# Patient Record
Sex: Male | Born: 2000 | Race: White | Hispanic: No | Marital: Single | Smoking: Never smoker
Health system: Southern US, Community
[De-identification: ages and names within clinical notes are randomized; demographics above are authoritative.]

## PROBLEM LIST (undated history)

## (undated) DIAGNOSIS — L80 Vitiligo: Secondary | ICD-10-CM

## (undated) DIAGNOSIS — L570 Actinic keratosis: Secondary | ICD-10-CM

## (undated) DIAGNOSIS — E559 Vitamin D deficiency, unspecified: Secondary | ICD-10-CM

## (undated) DIAGNOSIS — L503 Dermatographic urticaria: Secondary | ICD-10-CM

## (undated) DIAGNOSIS — J309 Allergic rhinitis, unspecified: Secondary | ICD-10-CM

## (undated) DIAGNOSIS — F329 Major depressive disorder, single episode, unspecified: Secondary | ICD-10-CM

## (undated) DIAGNOSIS — M2142 Flat foot [pes planus] (acquired), left foot: Secondary | ICD-10-CM

## (undated) DIAGNOSIS — G4721 Circadian rhythm sleep disorder, delayed sleep phase type: Secondary | ICD-10-CM

## (undated) DIAGNOSIS — T8859XA Other complications of anesthesia, initial encounter: Secondary | ICD-10-CM

## (undated) DIAGNOSIS — F909 Attention-deficit hyperactivity disorder, unspecified type: Secondary | ICD-10-CM

## (undated) DIAGNOSIS — G43009 Migraine without aura, not intractable, without status migrainosus: Secondary | ICD-10-CM

## (undated) DIAGNOSIS — R41840 Attention and concentration deficit: Secondary | ICD-10-CM

## (undated) DIAGNOSIS — R42 Dizziness and giddiness: Secondary | ICD-10-CM

## (undated) DIAGNOSIS — R109 Unspecified abdominal pain: Secondary | ICD-10-CM

## (undated) DIAGNOSIS — F419 Anxiety disorder, unspecified: Secondary | ICD-10-CM

## (undated) DIAGNOSIS — M2141 Flat foot [pes planus] (acquired), right foot: Secondary | ICD-10-CM

## (undated) HISTORY — DX: Circadian rhythm sleep disorder, delayed sleep phase type: G47.21

## (undated) HISTORY — DX: Unspecified abdominal pain: R10.9

## (undated) HISTORY — DX: Actinic keratosis: L57.0

## (undated) HISTORY — DX: Anxiety disorder, unspecified: F41.9

## (undated) HISTORY — DX: Vitamin D deficiency, unspecified: E55.9

## (undated) HISTORY — DX: Dermatographic urticaria: L50.3

## (undated) HISTORY — DX: Vitiligo: L80

## (undated) HISTORY — DX: Allergic rhinitis, unspecified: J30.9

## (undated) HISTORY — DX: Attention-deficit hyperactivity disorder, unspecified type: F90.9

## (undated) HISTORY — DX: Attention and concentration deficit: R41.840

## (undated) HISTORY — DX: Flat foot (pes planus) (acquired), right foot: M21.42

## (undated) HISTORY — DX: Dizziness and giddiness: R42

## (undated) HISTORY — DX: Flat foot (pes planus) (acquired), right foot: M21.41

## (undated) HISTORY — PX: TONSILLECTOMY: SUR1361

## (undated) HISTORY — DX: Migraine without aura, not intractable, without status migrainosus: G43.009

## (undated) HISTORY — DX: Major depressive disorder, single episode, unspecified: F32.9

---
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
## 2021-01-31 ENCOUNTER — Encounter: Payer: Self-pay | Admitting: Neurology

## 2021-02-11 ENCOUNTER — Ambulatory Visit (HOSPITAL_COMMUNITY)
Admission: EM | Admit: 2021-02-11 | Discharge: 2021-02-11 | Disposition: A | Discharge status: Home / Self Care | Payer: 59

## 2021-02-11 ENCOUNTER — Other Ambulatory Visit: Payer: Self-pay

## 2021-02-11 DIAGNOSIS — F481 Depersonalization-derealization syndrome: Secondary | ICD-10-CM | POA: Diagnosis not present

## 2021-02-11 NOTE — BH Assessment (Signed)
TTS triage: Patient presents to [facility] for evaluation. He states for the past week he has felt increasingly anxious and "disconnected." Currently prescribed Zoloft by PCP. He denies SI/HI/AVH or substance use.  Patient is routine.

## 2021-02-11 NOTE — Discharge Instructions (Addendum)
Please follow up with your ADHD doctor and FM doctor to discuss what you have been experiencing.

## 2021-02-11 NOTE — ED Provider Notes (Signed)
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 male w/ hx of anxiety and depression and ADHD presents to [facility] due to feeling "disconnected" from reality. Pt here w/ father. Pt states he feels like he is in a "dreamlike" state for extended periods of time over past 2 weeks. Pt states he has never experienced this before. Pt states only major change to lifestyle was that he restarted Qelbree for his ADHD. Pt was originally on 21 day trial for ADHD and experienced no symptoms but since restarting it 1 week later after the trial, he began experiencing symptoms of "mind fog". Pt states nothing exacerbates this but does state that he feels better when he goes to sleep. Pt states he still is sleeping and eating appropriately. 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 Prior to past 2 weeks, pt's anxiety and depression were well controlled with Zoloft 100 mg qd for past 2 years. Pt states he has had multiple trials of medications for ADHD of which he experienced adverse side effects from medication. Pt states the [place] is not particularly effective for his ADHD as he does not notice much difference in concentration.   Pt denies SI/HI/AVH.   Per dad, patient does not appear to exhibit any signs of "disconnected" feelings that the patient has endorsed.  Father did not notice any changes in behavior.  Pt does not smoke, drink, or use any illicit substances besides vaping nicotine.   Psychiatric Specialty Exam  Presentation  General Appearance:Appropriate for Environment  Eye Contact:Good  Speech:Clear and Coherent  Speech  Volume:Normal  Handedness:No data recorded  Mood and Affect  Mood:Euthymic  Affect:Appropriate   Thought Process  Thought Processes:Coherent  Descriptions of Associations:Intact  Orientation:Full (Time, Place and Person)  Thought Content:Logical    Hallucinations:None  Ideas of Reference:None  Suicidal Thoughts:No  Homicidal Thoughts:No   Sensorium  Memory:Immediate Good; Recent Good; Remote Good  Judgment:Good  Insight:Good   Executive Functions  Concentration:Good  Attention Span:Good  Recall:Good  Fund of Knowledge:Good  Language:Good   Psychomotor Activity  Psychomotor Activity:Normal   Assets  Assets:Social Support; Housing; Armed forces logistics/support/administrative officer; Desire for Improvement   Sleep  Sleep:Good  Number of hours: 7   No data recorded  Physical Exam: Physical Exam Vitals and nursing note reviewed.  Constitutional:      Appearance: He is well-developed.  HENT:     Head: Normocephalic and atraumatic.  Eyes:     Conjunctiva/sclera: Conjunctivae normal.  Cardiovascular:     Rate and Rhythm: Normal rate and regular rhythm.     Heart sounds: No murmur heard. Pulmonary:     Effort: Pulmonary effort is normal. No respiratory distress.     Breath sounds: Normal breath sounds.  Abdominal:     Palpations: Abdomen is soft.     Tenderness: There is no abdominal tenderness.  Musculoskeletal:     Cervical back: Neck supple.  Skin:    General: Skin is warm and dry.  Neurological:     Mental Status: He is alert.   Review of Systems  Constitutional:  Negative for chills and fever.  Respiratory:  Negative for cough, shortness of breath and wheezing.  Cardiovascular:  Negative for chest pain and palpitations.  Gastrointestinal:  Negative for abdominal pain, nausea and vomiting.  Skin:  Negative for itching and rash.  Neurological:  Negative for dizziness and headaches.  Blood pressure 128/89, pulse 71, temperature 98.5 F (36.9 C), temperature  source Oral, resp. rate 18, height 6' (1.829 m), weight 205 lb (93 kg), SpO2 98 %. Body mass index is 27.8 kg/m.  Musculoskeletal: Strength & Muscle Tone: within normal limits Gait & Station: normal Patient leans: [place] MSE Discharge Disposition for Follow up and Recommendations: Based on my evaluation the patient does not appear to have an emergency medical condition and can be discharged with resources and follow up care in outpatient services for Medication Management  Recommended pt follow up w/ ADHD physician and FM physician to discuss symptoms pt is experiencing and potentially adjusting Qelbree dosage/usage.  France Ravens, MD 02/11/2021, 2:23 PM

## 2021-02-13 ENCOUNTER — Encounter (HOSPITAL_BASED_OUTPATIENT_CLINIC_OR_DEPARTMENT_OTHER): Payer: Self-pay

## 2021-02-13 ENCOUNTER — Emergency Department (HOSPITAL_BASED_OUTPATIENT_CLINIC_OR_DEPARTMENT_OTHER): Payer: 59 | Admitting: Radiology

## 2021-02-13 ENCOUNTER — Emergency Department (HOSPITAL_BASED_OUTPATIENT_CLINIC_OR_DEPARTMENT_OTHER)
Admission: EM | Admit: 2021-02-13 | Discharge: 2021-02-13 | Disposition: A | Discharge status: Home / Self Care | Payer: 59 | Attending: Emergency Medicine | Admitting: Emergency Medicine

## 2021-02-13 ENCOUNTER — Emergency Department (HOSPITAL_BASED_OUTPATIENT_CLINIC_OR_DEPARTMENT_OTHER): Payer: 59

## 2021-02-13 DIAGNOSIS — R42 Dizziness and giddiness: Secondary | ICD-10-CM | POA: Diagnosis not present

## 2021-02-13 DIAGNOSIS — R0789 Other chest pain: Secondary | ICD-10-CM | POA: Diagnosis present

## 2021-02-13 DIAGNOSIS — Z20822 Contact with and (suspected) exposure to covid-19: Secondary | ICD-10-CM | POA: Insufficient documentation

## 2021-02-13 LAB — CBC WITH DIFFERENTIAL/PLATELET
Abs Immature Granulocytes: 0.01 10*3/uL (ref 0.00–0.07)
Basophils Absolute: 0 10*3/uL (ref 0.0–0.1)
Basophils Relative: 1 %
Eosinophils Absolute: 0.1 10*3/uL (ref 0.0–0.5)
Eosinophils Relative: 2 %
HCT: 43 % (ref 39.0–52.0)
Hemoglobin: 14.8 g/dL (ref 13.0–17.0)
Immature Granulocytes: 0 %
Lymphocytes Relative: 13 %
Lymphs Abs: 0.9 10*3/uL (ref 0.7–4.0)
MCH: 29.2 pg (ref 26.0–34.0)
MCHC: 34.4 g/dL (ref 30.0–36.0)
MCV: 85 fL (ref 80.0–100.0)
Monocytes Absolute: 0.5 10*3/uL (ref 0.1–1.0)
Monocytes Relative: 8 %
Neutro Abs: 5.1 10*3/uL (ref 1.7–7.7)
Neutrophils Relative %: 76 %
Platelets: 246 10*3/uL (ref 150–400)
RBC: 5.06 MIL/uL (ref 4.22–5.81)
RDW: 12.1 % (ref 11.5–15.5)
WBC: 6.7 10*3/uL (ref 4.0–10.5)
nRBC: 0 % (ref 0.0–0.2)

## 2021-02-13 LAB — BASIC METABOLIC PANEL
Anion gap: 10 (ref 5–15)
BUN: 18 mg/dL (ref 6–20)
CO2: 25 mmol/L (ref 22–32)
Calcium: 9.6 mg/dL (ref 8.9–10.3)
Chloride: 104 mmol/L (ref 98–111)
Creatinine, Ser: 0.87 mg/dL (ref 0.61–1.24)
GFR, Estimated: >60 mL/min (ref >60)
Glucose, Bld: 94 mg/dL (ref 70–99)
Potassium: 4 mmol/L (ref 3.5–5.1)
Sodium: 139 mmol/L (ref 135–145)

## 2021-02-13 LAB — TROPONIN I (HIGH SENSITIVITY): Troponin I (High Sensitivity): <2 ng/L (ref <18)

## 2021-02-13 LAB — RESP PANEL BY RT-PCR (FLU A&B, COVID) ARPGX2
Influenza A by PCR: NEGATIVE
Influenza B by PCR: NEGATIVE
SARS Coronavirus 2 by RT PCR: NEGATIVE

## 2021-02-13 MED ORDER — SODIUM CHLORIDE 0.9 % IV BOLUS
500.0000 mL | Freq: Once | INTRAVENOUS | Status: AC
  Administered 2021-02-13: 500 mL via INTRAVENOUS

## 2021-02-13 MED ORDER — ACETAMINOPHEN 500 MG PO TABS
1000.0000 mg | ORAL_TABLET | Freq: Four times a day (QID) | ORAL | Status: DC | PRN
Start: 2021-02-13 — End: 2021-02-13
  Administered 2021-02-13: 1000 mg via ORAL
  Filled 2021-02-13: qty 2

## 2021-02-13 NOTE — ED Triage Notes (Signed)
He reports feeling general unease with some chest discomfort and profound feelings of disattachment x 1-2 weeks. He has recently taken and subsequently stopped Qelbree. He has also recently taken a couple of doses of Topamax for h/a. His mother is with him.

## 2021-02-13 NOTE — Discharge Instructions (Addendum)
Stay well-hydrated. Follow-up closely with the doctor that prescribes her medications. Return for new or worsening signs or symptoms.

## 2021-02-13 NOTE — ED Provider Notes (Signed)
[facility] EMERGENC[facility] DEPT Provider Note   CSN: [id number] Arrival date & time: 02/13/21  1009     History Chief Complaint  Patient presents with   chest pain/feelings of disattachment    Garrett Booker is a 20 y.o. male.  Patient presents with feeling generally uneasy, intermittent chest discomfort, dizziness over the past 10 days.  Patient recently was taking and subsequently stopped Deboraha Sprang under guidance of physician.  Patient recently took few doses of Topamax for headache.  Patient presents with his mother.  Patient is also had headaches in the morning the past 3 days and migraine/pressure headaches recently.  No family history concerning.  Patient denies fevers chills or respiratory symptoms.      Past Medical History:  Diagnosis Date   Abdominal pain     Patient Active Problem List   Diagnosis Date Noted   Dizziness 06/01/2015   Abnormal thyroid blood test 11/06/2013   Vitiligo 11/06/2013   Hypovitaminosis D 11/06/2013   Simple constipation Q000111Q   Periumbilical abdominal pain     Past Surgical History:  Procedure Laterality Date   TONSILLECTOMY         Family History  Problem Relation Age of Onset   Celiac disease Neg Hx    Ulcers Neg Hx    Thyroid disease Mother    Cancer Maternal Grandmother        breast   Thyroid disease Maternal Grandmother    Diabetes Maternal Grandfather     Social History   Tobacco Use   Smoking status: Never   Smokeless tobacco: Never  Substance Use Topics   Alcohol use: No   Drug use: No    Home Medications Prior to Admission medications   Medication Sig Start Date End Date Taking? Authorizing Provider  cetirizine (ZYRTEC) 10 MG tablet Take 10 mg by mouth daily.    [provider]  fluticasone (FLONASE) 50 MCG/ACT nasal spray Place 1 spray into both nostrils daily.    [provider]  ibuprofen (ADVIL,MOTRIN) 200 MG tablet Take 200 mg by mouth every 6 (six) hours as  needed.    [provider]  montelukast (SINGULAIR) 5 MG chewable tablet Chew 5 mg by mouth at bedtime.    [provider]  tacrolimus (PROTOPIC) 0.1 % ointment Apply topically 2 (two) times daily.    [provider]  triamcinolone lotion (KENALOG) 0.1 % Apply 1 application topically 3 (three) times daily.    [provider]    Allergies    Albuterol  Review of Systems   Review of Systems  Constitutional:  Positive for fatigue. Negative for chills and fever.  HENT:  Negative for congestion.   Eyes:  Negative for visual disturbance.  Respiratory:  Negative for shortness of breath.   Cardiovascular:  Negative for chest pain.  Gastrointestinal:  Positive for nausea. Negative for abdominal pain and vomiting.  Genitourinary:  Negative for dysuria and flank pain.  Musculoskeletal:  Negative for back pain, neck pain and neck stiffness.  Skin:  Negative for rash.  Neurological:  Positive for dizziness, light-headedness and headaches.   Physical Exam Updated Vital Signs BP 116/65   Pulse 65   Temp 98.1 F (36.7 C)   Resp 18   SpO2 100%   Physical Exam Vitals and nursing note reviewed.  Constitutional:      General: He is not in acute distress.    Appearance: He is well-developed.  HENT:     Head: Normocephalic and atraumatic.  Mouth/Throat:     Mouth: Mucous membranes are moist.  Eyes:     General:        Right eye: No discharge.        Left eye: No discharge.     Conjunctiva/sclera: Conjunctivae normal.  Neck:     Trachea: No tracheal deviation.  Cardiovascular:     Rate and Rhythm: Normal rate and regular rhythm.     Heart sounds: No murmur heard. Pulmonary:     Effort: Pulmonary effort is normal.     Breath sounds: Normal breath sounds.  Abdominal:     General: There is no distension.     Palpations: Abdomen is soft.     Tenderness: There is no abdominal tenderness. There is no guarding.  Musculoskeletal:        General:  Normal range of motion.     Cervical back: Normal range of motion and neck supple. No rigidity.  Skin:    General: Skin is warm.     Capillary Refill: Capillary refill takes less than 2 seconds.     Findings: No rash.  Neurological:     General: No focal deficit present.     Mental Status: He is alert and oriented to person, place, and time.     Cranial Nerves: No cranial nerve deficit.     Sensory: No sensory deficit.     Motor: No weakness.     Coordination: Coordination normal.     Gait: Gait normal.  Psychiatric:        Mood and Affect: Mood is anxious. Mood is not depressed. Affect is not tearful.        Speech: Speech is not rapid and pressured.    ED Results / Procedures / Treatments   Labs (all labs ordered are listed, but only abnormal results are displayed) Labs Reviewed  RESP PANEL BY RT-PCR (FLU A&B, COVID) [id number]  BASIC METABOLIC PANEL  CBC WITH DIFFERENTIAL/PLATELET  TROPONIN I (HIGH SENSITIVITY)    EKG EKG Interpretation  Date/Time:  Sunday February 13 2021 10:25:34 EDT Ventricular Rate:  67 PR Interval:  135 QRS Duration: 104 QT Interval:  393 QTC Calculation: 415 R Axis:   85 Text Interpretation: Sinus rhythm Borderline Q waves in inferior leads Confirmed by Elnora Morrison [phone]) on 02/13/2021 10:30:43 AM  Radiology DG Chest 2 View  Result Date: 02/13/2021 CLINICAL DATA:  Chest pain. EXAM: CHEST - 2 VIEW COMPARISON:  August 01, 2009 FINDINGS: The heart size and mediastinal contours are within normal limits. Both lungs are clear. The visualized skeletal structures are unremarkable. IMPRESSION: No active cardiopulmonary disease. Electronically Signed   By: Dorise Bullion III M.D.   On: 02/13/2021 11:10   CT HEAD WO CONTRAST (5MM)  Result Date: 02/13/2021 CLINICAL DATA:  Nausea, vomiting, dizziness, head pressure. EXAM: CT HEAD WITHOUT CONTRAST TECHNIQUE: Contiguous axial images were obtained from the base of the skull through the vertex without  intravenous contrast. COMPARISON:  None. FINDINGS: Brain: No hydrocephalus. No mass, hemorrhage, edema or other evidence of acute parenchymal abnormality. No extra-axial hemorrhage. Vascular: No hyperdense vessel or unexpected calcification. Skull: Normal. Negative for fracture or focal lesion. Sinuses/Orbits: No acute finding. Other: None. IMPRESSION: Negative head CT. No intracranial mass, hemorrhage or edema. Electronically Signed   By: Franki Cabot M.D.   On: 02/13/2021 12:10    Procedures Procedures   Medications Ordered in ED Medications  acetaminophen (TYLENOL) tablet 1,000 mg (1,000 mg Oral Given 02/13/21 1206)  sodium chloride 0.9 %  bolus 500 mL (0 mLs Intravenous Stopped 02/13/21 1238)    ED Course  I have reviewed the triage vital signs and the nursing notes.  Pertinent labs & imaging results that were available during my care of the patient were reviewed by me and considered in my medical decision making (see chart for details).    MDM Rules/Calculators/A&P                           Patient with history of anxiety and recently taken off medication presents with atypical chest discomfort and generalized symptoms.  Discussed differential including anxiety, psychiatric, electrolyte imbalances, medication side effect, intracranial with headaches in the morning and vomiting, other.  Patient has not suicidal and feels safe at home.  Plan for generalized blood, CT scan of the head, IV fluids and Tylenol as needed. Troponin negative, no active chest pain, patient very low risk with no risk factors and 20 years of age.  No further work-up for ACS needed at this time.  Patient's blood work was reviewed normal electrolytes, normal kidney function, no signs of significant anemia or bleeding.  CT scan of the head reviewed no swelling, bleeding or obvious mass.  Patient stable for continued outpatient follow-up.  Final Clinical Impression(s) / ED Diagnoses Final diagnoses:  Atypical chest  pain  Dizziness    Rx / DC Orders ED Discharge Orders     None        Elnora Morrison, MD 02/13/21 1255

## 2021-02-25 ENCOUNTER — Encounter: Payer: Self-pay | Admitting: Gastroenterology

## 2021-02-25 ENCOUNTER — Ambulatory Visit: Payer: 59 | Admitting: Gastroenterology

## 2021-02-25 VITALS — BP 122/86 | HR 67 | Ht 69.0 in | Wt 200.0 lb

## 2021-02-25 DIAGNOSIS — R1310 Dysphagia, unspecified: Secondary | ICD-10-CM | POA: Diagnosis not present

## 2021-02-25 DIAGNOSIS — R112 Nausea with vomiting, unspecified: Secondary | ICD-10-CM

## 2021-02-25 NOTE — Patient Instructions (Addendum)
It was my pleasure to provide care to you today. Based on our discussion, I am providing you with my recommendations below:  RECOMMENDATION(S):   ENDOSCOPY:   You have been scheduled for an endoscopy. Please follow written instructions given to you at your visit today.  INHALERS:   If you use inhalers (even only as needed), please bring them with you on the day of your procedure.  FOLLOW UP:  After your procedure, you will receive a call from my office staff regarding my recommendation for follow up.  BMI:  If you are age 16 or younger, your body mass index should be between 19-25. Your Body mass index is 29.53 kg/m. If this is out of the aformentioned range listed, please consider follow up with your Primary Care Provider.   MY CHART:  The [facility] providers would like to encourage you to use [facility] to communicate with providers for non-urgent requests or questions.  Due to long hold times on the telephone, sending your provider a message by [facility] may be a faster and more efficient way to get a response.  Please allow 48 business hours for a response.  Please remember that this is for non-urgent requests.   Thank you for trusting me with your gastrointestinal care!    Dustin Flock, MD

## 2021-02-25 NOTE — Progress Notes (Signed)
HPI : Garrett Booker is a very pleasant 20 year old male referred by Benjiman Core, PA for symptoms of chest pain and tightness, dysphagia and nausea with vomiting.  The symptoms started 8 or 9 days ago.  Started abruptly when he woke up and could not keep breakfast down.  He has been having episodes of significant nausea in the morning followed by vomiting of breakfast.  Typically, the symptoms improved during the course of the day.  He also has frequent symptoms of food getting stuck in his chest, and having to wash it down with beverage.  No episodes of food getting stuck requiring forced vomiting.  He does not have symptoms of heartburn or acid regurgitation.  He denies any chronic lower GI symptoms such as abdominal pain, constipation, diarrhea or blood in the stool.  His weight has been stable. He went to the emergency room about 2 weeks ago with atypical chest pain and general uneasiness.  This was shortly after he had been taken off Camillia Herter for his ADHD.  He had a CT head and labs which were unremarkable.  He was given reassurance and discharged home.   Past Medical History:  Diagnosis Date   Abdominal pain      Past Surgical History:  Procedure Laterality Date   TONSILLECTOMY     Family History  Problem Relation Age of Onset   Celiac disease Neg Hx    Ulcers Neg Hx    Thyroid disease Mother    Cancer Maternal Grandmother        breast   Thyroid disease Maternal Grandmother    Diabetes Maternal Grandfather    Social History   Tobacco Use   Smoking status: Never   Smokeless tobacco: Never  Substance Use Topics   Alcohol use: No   Drug use: No   Current Outpatient Medications  Medication Sig Dispense Refill   cetirizine (ZYRTEC) 10 MG tablet Take 10 mg by mouth daily.     fluticasone (FLONASE) 50 MCG/ACT nasal spray Place 1 spray into both nostrils daily.     ibuprofen (ADVIL,MOTRIN) 200 MG tablet Take 200 mg by mouth every 6 (six) hours as needed.     sertraline  (ZOLOFT) 100 MG tablet Take 100 mg by mouth daily.     No current facility-administered medications for this visit.   Allergies  Allergen Reactions   Albuterol      Review of Systems: All systems reviewed and negative except where noted in HPI.    DG Chest 2 View  Result Date: 02/13/2021 CLINICAL DATA:  Chest pain. EXAM: CHEST - 2 VIEW COMPARISON:  August 01, 2009 FINDINGS: The heart size and mediastinal contours are within normal limits. Both lungs are clear. The visualized skeletal structures are unremarkable. IMPRESSION: No active cardiopulmonary disease. Electronically Signed   By: Dorise Bullion III M.D.   On: 02/13/2021 11:10   CT HEAD WO CONTRAST (5MM)  Result Date: 02/13/2021 CLINICAL DATA:  Nausea, vomiting, dizziness, head pressure. EXAM: CT HEAD WITHOUT CONTRAST TECHNIQUE: Contiguous axial images were obtained from the base of the skull through the vertex without intravenous contrast. COMPARISON:  None. FINDINGS: Brain: No hydrocephalus. No mass, hemorrhage, edema or other evidence of acute parenchymal abnormality. No extra-axial hemorrhage. Vascular: No hyperdense vessel or unexpected calcification. Skull: Normal. Negative for fracture or focal lesion. Sinuses/Orbits: No acute finding. Other: None. IMPRESSION: Negative head CT. No intracranial mass, hemorrhage or edema. Electronically Signed   By: Franki Cabot M.D.   On: 02/13/2021 12:10  Physical Exam: BP 122/86   Pulse 67   Ht '5\' 9"'$  (1.753 m)   Wt 200 lb (90.7 kg)   BMI 29.53 kg/m  Constitutional: Pleasant,well-developed, Caucasian male in no acute distress.  Accompanied by mother HEENT: Normocephalic and atraumatic. Conjunctivae are normal. No scleral icterus. MP2 Neck:  Supple, no thryomegaly Cardiovascular: Normal rate, regular rhythm.  Pulmonary/chest: Effort normal and breath sounds normal. No wheezing, rales or rhonchi. Abdominal: Soft, nondistended, mild tenderness to palpation in the epigastrium. Bowel  sounds active throughout. There are no masses palpable. No hepatomegaly. Extremities: no edema Lymphadenopathy: No cervical adenopathy noted. Neurological: Alert and oriented to person place and time. Skin: Skin is warm and dry. No rashes noted. Psychiatric: Normal mood and affect. Behavior is normal.  CBC    Component Value Date/Time   WBC 6.7 02/13/2021 1134   RBC 5.06 02/13/2021 1134   HGB 14.8 02/13/2021 1134   HCT 43.0 02/13/2021 1134   PLT 246 02/13/2021 1134   MCV 85.0 02/13/2021 1134   MCH 29.2 02/13/2021 1134   MCHC 34.4 02/13/2021 1134   RDW 12.1 02/13/2021 1134   LYMPHSABS 0.9 02/13/2021 1134   MONOABS 0.5 02/13/2021 1134   EOSABS 0.1 02/13/2021 1134   BASOSABS 0.0 02/13/2021 1134    CMP     Component Value Date/Time   NA 139 02/13/2021 1134   K 4.0 02/13/2021 1134   CL 104 02/13/2021 1134   CO2 25 02/13/2021 1134   GLUCOSE 94 02/13/2021 1134   BUN 18 02/13/2021 1134   CREATININE 0.87 02/13/2021 1134   CALCIUM 9.6 02/13/2021 1134   GFRNONAA >60 02/13/2021 1134     ASSESSMENT AND PLAN: 20 year old male with ADHD with 8 to 9 days of nausea and vomiting with dysphagia in the setting of recent medication changes for his ADHD.  I suspect that the symptoms are related to his medication change, however given his symptoms of dysphagia and his demographic (young Caucasian male), he is high risk for eosinophilic esophagitis.  I offered an upper endoscopy to exclude this, versus observation.  Both the patient and his mother would like to proceed with an upper endoscopy.  Dysphagia/nausea/vomiting - EGD  The details, risks (including bleeding, perforation, infection, missed lesions, medication reactions and possible hospitalization or surgery if complications occur), benefits, and alternatives to EGD with possible biopsy and possible dilation were discussed with the patient and he consents to proceed.    Jensen Cheramie E. Candis Schatz, MD [facility]    CC:   Ephriam Jenkins, [place]

## 2021-03-02 ENCOUNTER — Emergency Department (HOSPITAL_BASED_OUTPATIENT_CLINIC_OR_DEPARTMENT_OTHER): Payer: 59

## 2021-03-02 ENCOUNTER — Emergency Department (HOSPITAL_BASED_OUTPATIENT_CLINIC_OR_DEPARTMENT_OTHER)
Admission: EM | Admit: 2021-03-02 | Discharge: 2021-03-02 | Disposition: A | Discharge status: Home / Self Care | Payer: 59 | Attending: Emergency Medicine | Admitting: Emergency Medicine

## 2021-03-02 ENCOUNTER — Other Ambulatory Visit: Payer: Self-pay

## 2021-03-02 ENCOUNTER — Encounter (HOSPITAL_BASED_OUTPATIENT_CLINIC_OR_DEPARTMENT_OTHER): Payer: Self-pay | Admitting: Emergency Medicine

## 2021-03-02 DIAGNOSIS — U071 COVID-19: Secondary | ICD-10-CM | POA: Diagnosis not present

## 2021-03-02 DIAGNOSIS — R0789 Other chest pain: Secondary | ICD-10-CM | POA: Diagnosis present

## 2021-03-02 DIAGNOSIS — Z28310 Unvaccinated for covid-19: Secondary | ICD-10-CM | POA: Diagnosis not present

## 2021-03-02 NOTE — Discharge Instructions (Signed)
Listed below are the current CDC isolation guidelines for non-immunocompromised patients with mild to moderate Covid symptoms.  These guidelines apply to this patient.  The above-mentioned patient should quarantine at home for 5-10 days from the onset of their symptoms.  They may return when they are fever-free for 24 hours without use of medications, or after 10 days.  If they return before the 10th day of symptom onset, they should wear a well-fitted mask until Day 10.

## 2021-03-02 NOTE — ED Notes (Signed)
Dr Langston Masker in room w/pt now.

## 2021-03-02 NOTE — ED Provider Notes (Signed)
[facility] EMERGENCY DEPT Provider Note   CSN: [id number] Arrival date & time: 03/02/21  1104     History Chief Complaint  Patient presents with   Shortness of Breath    Garrett Booker is a 20 y.o. male present emergency department chest tightness in the setting of COVID diagnosis.  The patient reports that he tested positive for COVID 3 days ago began having symptoms at that time.  He reports he had a very mild cough.  He reports gradual onset of bilateral chest tightness which waxes and wanes but is persistent now.  He does suffer from gastritis and heartburn in the past, but is not sure if this feels similar.  He denies lightheadedness, loss of consciousness, or prior history of chest pains.  He denies history of asthma or pulmonary disease.  He reports he is otherwise healthy.  He does report a history of allergies to albuterol (hives/rash).  He has not had the COVID vaccines.  He is in college at [employer].  HPI     Past Medical History:  Diagnosis Date   Abdominal pain     Patient Active Problem List   Diagnosis Date Noted   Dizziness 06/01/2015   Abnormal thyroid blood test 11/06/2013   Vitiligo 11/06/2013   Hypovitaminosis D 11/06/2013   Simple constipation Q000111Q   Periumbilical abdominal pain     Past Surgical History:  Procedure Laterality Date   TONSILLECTOMY         Family History  Problem Relation Age of Onset   Celiac disease Neg Hx    Ulcers Neg Hx    Thyroid disease Mother    Cancer Maternal Grandmother        breast   Thyroid disease Maternal Grandmother    Diabetes Maternal Grandfather     Social History   Tobacco Use   Smoking status: Never   Smokeless tobacco: Never  Substance Use Topics   Alcohol use: No   Drug use: No    Home Medications Prior to Admission medications   Medication Sig Start Date End Date Taking? Authorizing Provider  cetirizine (ZYRTEC) 10 MG tablet Take 10 mg by mouth daily.    [provider]  fluticasone (FLONASE) 50 MCG/ACT nasal spray Place 1 spray into both nostrils daily.    [provider]  ibuprofen (ADVIL,MOTRIN) 200 MG tablet Take 200 mg by mouth every 6 (six) hours as needed.    [provider]  sertraline (ZOLOFT) 100 MG tablet Take 100 mg by mouth daily. 01/30/21   [provider]    Allergies    Albuterol  Review of Systems   Review of Systems  Constitutional:  Negative for chills and fever.  Respiratory:  Positive for cough, chest tightness and shortness of breath.   Cardiovascular:  Negative for palpitations and leg swelling.  Gastrointestinal:  Negative for abdominal pain and vomiting.  Genitourinary:  Negative for dysuria and hematuria.  Musculoskeletal:  Negative for arthralgias and back pain.  Skin:  Negative for color change and rash.  Neurological:  Negative for syncope and light-headedness.  All other systems reviewed and are negative.  Physical Exam Updated Vital Signs BP 123/80 (BP Location: Right Arm)   Pulse 68   Temp 98.5 F (36.9 C) (Oral)   Resp 15   Ht '6\' 3"'$  (1.905 m)   Wt 91.2 kg   SpO2 100%   BMI 25.12 kg/m   Physical Exam Constitutional:      General: He  is not in acute distress. HENT:     Head: Normocephalic and atraumatic.  Eyes:     Conjunctiva/sclera: Conjunctivae normal.     Pupils: Pupils are equal, round, and reactive to light.  Cardiovascular:     Rate and Rhythm: Normal rate and regular rhythm.  Pulmonary:     Effort: Pulmonary effort is normal. No respiratory distress.  Abdominal:     General: There is no distension.     Tenderness: There is no abdominal tenderness.  Skin:    General: Skin is warm and dry.  Neurological:     General: No focal deficit present.     Mental Status: He is alert. Mental status is at baseline.  Psychiatric:        Mood and Affect: Mood normal.        Behavior: Behavior normal.    ED Results / Procedures / Treatments   Labs (all labs  ordered are listed, but only abnormal results are displayed) Labs Reviewed - No data to display  EKG None  Radiology DG Chest Portable 1 View  Result Date: 03/02/2021 CLINICAL DATA:  COVID-19 positivity with shortness of breath EXAM: PORTABLE CHEST 1 VIEW COMPARISON:  02/13/2021 FINDINGS: The heart size and mediastinal contours are within normal limits. Both lungs are clear. The visualized skeletal structures are unremarkable. IMPRESSION: No active disease. Electronically Signed   By: Inez Catalina M.D.   On: 03/02/2021 12:36    Procedures Procedures   Medications Ordered in ED Medications - No data to display  ED Course  I have reviewed the triage vital signs and the nursing notes.  Pertinent labs & imaging results that were available during my care of the patient were reviewed by me and considered in my medical decision making (see chart for details).  Differential diagnosis includes bronchospasm versus gastritis in the setting of COVID-19 versus other.  He is not hypoxic.  He does not appear dehydrated.  He shows no signs or symptoms of sepsis or bacterial pneumonia.  His lungs breath sounds are clear bilaterally, no wheezing to suggest any reactive airway disease.  I personally reviewed his chest x-ray shows no focal infiltrate, no evidence for pneumothorax.  He is PERC negative for PE, I have a very low low suspicion for PE.  We discussed continued quarantine precautions based on the timing of his symptoms.  He has already discussed this with his college and they are aware of his positivity for covid.  Garrett Booker was evaluated in Emergency Department on 03/02/2021 for the symptoms described in the history of present illness. He was evaluated in the context of the global COVID-19 pandemic, which necessitated consideration that the patient might be at risk for infection with the SARS-CoV-2 virus that causes COVID-19. Institutional protocols and algorithms that pertain to the  evaluation of patients at risk for COVID-19 are in a state of rapid change based on information released by regulatory bodies including the CDC and federal and state organizations. These policies and algorithms were followed during the patient's care in the ED.       Final Clinical Impression(s) / ED Diagnoses Final diagnoses:  [id number]    Rx / DC Orders ED Discharge Orders     None        Yitzchak Kothari, Carola Rhine, MD 03/02/21 [phone]

## 2021-03-02 NOTE — ED Triage Notes (Signed)
Pt reports SOB and chest tightness for past few days. Pt tested positive at home for Covid on Monday.  Pt states mild cough and mild nausea.

## 2021-03-15 ENCOUNTER — Encounter: Payer: Self-pay | Admitting: Gastroenterology

## 2021-03-15 ENCOUNTER — Ambulatory Visit (AMBULATORY_SURGERY_CENTER): Payer: 59 | Admitting: Gastroenterology

## 2021-03-15 ENCOUNTER — Other Ambulatory Visit: Payer: Self-pay

## 2021-03-15 VITALS — BP 91/48 | HR 58 | Temp 98.2°F | Resp 16 | Ht 69.0 in | Wt 200.0 lb

## 2021-03-15 DIAGNOSIS — K319 Disease of stomach and duodenum, unspecified: Secondary | ICD-10-CM

## 2021-03-15 DIAGNOSIS — R1310 Dysphagia, unspecified: Secondary | ICD-10-CM | POA: Diagnosis present

## 2021-03-15 DIAGNOSIS — K219 Gastro-esophageal reflux disease without esophagitis: Secondary | ICD-10-CM

## 2021-03-15 DIAGNOSIS — K2970 Gastritis, unspecified, without bleeding: Secondary | ICD-10-CM

## 2021-03-15 DIAGNOSIS — K2289 Other specified disease of esophagus: Secondary | ICD-10-CM

## 2021-03-15 MED ORDER — SODIUM CHLORIDE 0.9 % IV SOLN: 500.0000 mL | Freq: Once | INTRAVENOUS | Status: DC

## 2021-03-15 NOTE — Progress Notes (Signed)
D.T. vital signs. °

## 2021-03-15 NOTE — Progress Notes (Signed)
Pt's states no medical or surgical changes since previsit or office visit. 

## 2021-03-15 NOTE — Progress Notes (Signed)
To pacu, VSS. Report to Rn.tb 

## 2021-03-15 NOTE — Op Note (Signed)
[place] Patient Name: Garrett Booker Procedure Date: 03/15/2021 10:11 AM MRN: [id number] Endoscopist: Nicki Reaper E. Candis Schatz , MD Age: 20 Referring MD:  Date of Birth: 03/31/2001 Gender: Male Account #: [id number] Procedure:                Upper GI endoscopy Indications:              Dysphagia Medicines:                Monitored Anesthesia Care Procedure:                Pre-Anesthesia Assessment:                           - Prior to the procedure, a History and Physical                            was performed, and patient medications and                            allergies were reviewed. The patient's tolerance of                            previous anesthesia was also reviewed. The risks                            and benefits of the procedure and the sedation                            options and risks were discussed with the patient.                            All questions were answered, and informed consent                            was obtained. Prior Anticoagulants: The patient has                            taken no previous anticoagulant or antiplatelet                            agents. ASA Grade Assessment: I - A normal, healthy                            patient. After reviewing the risks and benefits,                            the patient was deemed in satisfactory condition to                            undergo the procedure.                           After obtaining informed consent, the endoscope was  passed under direct vision. Throughout the                            procedure, the patient's blood pressure, pulse, and                            oxygen saturations were monitored continuously. The                            GIF [id number] was introduced through the                            mouth, and advanced to the third part of duodenum.                            The upper GI endoscopy was accomplished without                             difficulty. The patient tolerated the procedure                            well. Scope In: Scope Out: Findings:                 The examined portions of the nasopharynx,                            oropharynx and larynx were normal.                           The examined esophagus was normal. Biopsies were                            obtained from the proximal and distal esophagus                            with cold forceps for histology of suspected                            eosinophilic esophagitis. Estimated blood loss was                            minimal.                           Multiple dispersed erosions with no stigmata of                            recent bleeding were found in the gastric fundus,                            in the gastric body and in the gastric antrum.                            Biopsies were taken with a cold forceps for  Helicobacter pylori testing. Estimated blood loss                            was minimal.                           The exam of the stomach was otherwise normal.                           The examined duodenum was normal. Complications:            No immediate complications. Estimated Blood Loss:     Estimated blood loss was minimal. Impression:               - The examined portions of the nasopharynx,                            oropharynx and larynx were normal.                           - Normal esophagus. Biopsied.                           - Erosive gastropathy with no stigmata of recent                            bleeding. Biopsied. This may be related to                            medications such as NSAIDs. This is unlikely to be                            related to the patient's current symptoms of solid                            dysphagia.                           - Normal examined duodenum. Recommendation:           - Patient has a contact number available for                             emergencies. The signs and symptoms of potential                            delayed complications were discussed with the                            patient. Return to normal activities tomorrow.                            Written discharge instructions were provided to the                            patient.                           -  Resume previous diet.                           - Continue present medications.                           - Await pathology results.                           - Follow up as needed in the GI clinic for ongoing                            symptoms. Omar Gayden E. Candis Schatz, MD 03/15/2021 10:46:46 AM This report has been signed electronically.

## 2021-03-15 NOTE — Progress Notes (Signed)
History and Physical Interval Note: Nausea and vomiting have resolved.  Chest tightness also resolved.  Continues to have sensation of food sitting in his chest when he swallows.  03/15/2021 10:22 AM  Garrett Booker  has presented today for endoscopic procedure(s), with the diagnosis of  Encounter Diagnosis  Name Primary?   Dysphagia, unspecified type Yes  .  The various methods of evaluation and treatment have been discussed with the patient and/or family. After consideration of risks, benefits and other options for treatment, the patient has consented to  the endoscopic procedure(s).   The patient's history has been reviewed, patient examined, no change in status, stable for endoscopic procedure(s).  I have reviewed the patient's chart and labs.  Questions were answered to the patient's satisfaction.     Dorthea Maina E. Candis Schatz, MD [facility]

## 2021-03-15 NOTE — Progress Notes (Signed)
Called to room to assist during endoscopic procedure.  Patient ID and intended procedure confirmed with present staff. Received instructions for my participation in the procedure from the performing physician.  

## 2021-03-15 NOTE — Patient Instructions (Signed)
Resume previous diet and continue current medications. Awaiting pathology results. Follow up as needed in the GI office.  YOU HAD AN ENDOSCOPIC PROCEDURE TODAY AT [facility]:   Refer to the procedure report that was given to you for any specific questions about what was found during the examination.  If the procedure report does not answer your questions, please call your gastroenterologist to clarify.  If you requested that your care partner not be given the details of your procedure findings, then the procedure report has been included in a sealed envelope for you to review at your convenience later.  YOU SHOULD EXPECT: Some feelings of bloating in the abdomen. Passage of more gas than usual.  Walking can help get rid of the air that was put into your GI tract during the procedure and reduce the bloating. If you had a lower endoscopy (such as a colonoscopy or flexible sigmoidoscopy) you may notice spotting of blood in your stool or on the toilet paper. If you underwent a bowel prep for your procedure, you may not have a normal bowel movement for a few days.  Please Note:  You might notice some irritation and congestion in your nose or some drainage.  This is from the oxygen used during your procedure.  There is no need for concern and it should clear up in a day or so.  SYMPTOMS TO REPORT IMMEDIATELY:  Following upper endoscopy (EGD)  Vomiting of blood or coffee ground material  New chest pain or pain under the shoulder blades  Painful or persistently difficult swallowing  New shortness of breath  Fever of 100F or higher  Black, tarry-looking stools  For urgent or emergent issues, a gastroenterologist can be reached at any hour by calling [phone]. Do not use MyChart messaging for urgent concerns.    DIET:  We do recommend a small meal at first, but then you may proceed to your regular diet.  Drink plenty of fluids but you should avoid alcoholic beverages for 24  hours.  ACTIVITY:  You should plan to take it easy for the rest of today and you should NOT DRIVE or use heavy machinery until tomorrow (because of the sedation medicines used during the test).    FOLLOW UP: Our staff will call the number listed on your records 48-72 hours following your procedure to check on you and address any questions or concerns that you may have regarding the information given to you following your procedure. If we do not reach you, we will leave a message.  We will attempt to reach you two times.  During this call, we will ask if you have developed any symptoms of COVID 19. If you develop any symptoms (ie: fever, flu-like symptoms, shortness of breath, cough etc.) before then, please call [phone].  If you test positive for Covid 19 in the 2 weeks post procedure, please call and report this information to [place].    If any biopsies were taken you will be contacted by phone or by letter within the next 1-3 weeks.  Please call us at [phone] if you have not heard about the biopsies in 3 weeks.    SIGNATURES/CONFIDENTIALITY: You and/or your care partner have signed paperwork which will be entered into your electronic medical record.  These signatures attest to the fact that that the information above on your After Visit Summary has been reviewed and is understood.  Full responsibility of the confidentiality of this discharge information lies with  you and/or your care-partner.

## 2021-03-17 ENCOUNTER — Telehealth: Payer: Self-pay

## 2021-03-17 ENCOUNTER — Telehealth: Payer: Self-pay | Admitting: *Deleted

## 2021-03-17 NOTE — Telephone Encounter (Signed)
Called and left a message we tried to reach pt for a follow up call. maw  

## 2021-03-17 NOTE — Telephone Encounter (Signed)
Attempted 2nd f/u phone call. No answer. Left message.  °

## 2021-03-18 ENCOUNTER — Other Ambulatory Visit: Payer: Self-pay

## 2021-03-18 ENCOUNTER — Encounter: Payer: Self-pay | Admitting: Gastroenterology

## 2021-03-18 DIAGNOSIS — K2970 Gastritis, unspecified, without bleeding: Secondary | ICD-10-CM

## 2021-03-18 MED ORDER — OMEPRAZOLE 40 MG PO CPDR: 40.0000 mg | DELAYED_RELEASE_CAPSULE | Freq: Every day | ORAL | 1 refills | Status: DC

## 2021-03-18 NOTE — Progress Notes (Signed)
Garrett Booker, The biopsies taken from your stomach were notable for mild reactive gastropathy which is a common finding and often related to use of certain medications (usually NSAIDs), but there was no evidence of Helicobacter pylori infection. This common finding is not felt to necessarily be a cause of any particular symptom and there is no specific treatment or further evaluation recommended.  The biopsies taken from your esophagus did not show any evidence of eosinophilic esophagitis, but they did show histologic evidence of irritation from gastroesophageal reflux.  Currently there is no overt evidence of damage to the esophagus. Reflux typically causes symptoms such as burning pain in the chest and acid regurgitation, but sometimes can cause symptoms of difficulty swallowing or other types of chest discomfort.  If you continue to have symptoms, you may benefit from treatment with acid suppressing medication such as Prilosec.  Let me know if you would like a prescription.

## 2021-03-24 ENCOUNTER — Telehealth: Payer: Self-pay | Admitting: Gastroenterology

## 2021-03-24 NOTE — Telephone Encounter (Signed)
Left a message for the patient's mother to return call

## 2021-03-24 NOTE — Telephone Encounter (Signed)
Inbound call from mother, Arby Barrette. States patient is having dark, black stool. Asking if there if he could possibly have a stool sample to figure out why.  Patient did have EGD on 9/13.   Call mother, Arby Barrette back at [phone]

## 2021-03-28 NOTE — Telephone Encounter (Signed)
I spoke with the patient's mother.  Patient has been taking Pepto Bismol last week.  He is no longer having black stools.

## 2021-04-22 ENCOUNTER — Ambulatory Visit: Payer: 59 | Admitting: Neurology

## 2021-05-13 ENCOUNTER — Ambulatory Visit: Payer: 59 | Admitting: Internal Medicine

## 2021-05-13 ENCOUNTER — Encounter: Payer: Self-pay | Admitting: Internal Medicine

## 2021-05-13 ENCOUNTER — Other Ambulatory Visit: Payer: Self-pay

## 2021-05-13 ENCOUNTER — Ambulatory Visit (INDEPENDENT_AMBULATORY_CARE_PROVIDER_SITE_OTHER): Payer: 59

## 2021-05-13 VITALS — BP 122/70 | HR 73 | Ht 69.02 in | Wt 204.6 lb

## 2021-05-13 DIAGNOSIS — R002 Palpitations: Secondary | ICD-10-CM

## 2021-05-13 NOTE — Patient Instructions (Signed)
Medication Instructions:  No changes *If you need a refill on your cardiac medications before your next appointment, please call your pharmacy*   Lab Work: Today: cbc, cmet, tsh  If you have labs (blood work) drawn today and your tests are completely normal, you will receive your results only by: [place] (if you have MyChart) OR A paper copy in the mail If you have any lab test that is abnormal or we need to change your treatment, we will call you to review the results.   Testing/Procedures: Your physician has requested that you have an echocardiogram. Echocardiography is a painless test that uses sound waves to create images of your heart. It provides your doctor with information about the size and shape of your heart and how well your heart's chambers and valves are working. This procedure takes approximately one hour. There are no restrictions for this procedure.  Zio XT Patch 3 days.  To be applied today.   Follow-Up: As needed

## 2021-05-13 NOTE — Progress Notes (Signed)
Cardiology Office Note:    Date:  05/13/2021   ID:  Garrett Booker, DOB Nov 22, 2000, MRN [id number]  PCP:  Ephriam Jenkins, PA (Inactive)  Cardiologist:  None  Electrophysiologist:  None   Referring MD: Shirline Frees, MD   Chief Complaint/Reason for Referral: Palpitations  History of Present Illness:    Garrett Booker is a 20 y.o. male with the indicated history here for recommendations regarding shortness of breath and chest tightness with palpitations over the last month or so.  The patient was seen by his primary care provider's office recently.  At that office visit he was not having any chest pain symptoms of heart palpitations at that time.  EKG done in their office demonstrated normal sinus rhythm with no acute ST changes or Q waves.  He had presented to the emergency department in late August with shortness of breath and chest tightness.  An EKG and troponins were unremarkable.  He tested negative for coronavirus.  He reports palpitations every 3 hours.  They are associated with some shortness of breath.  He denies any other cardiac symptoms.  He recently had some medication changes since he was initially being treated for ADHD but these were stopped since not but they really have this.  He is on sertraline and Abilify for depression and anxiety.  He is required no recent hospitalizations or emergency room visits.  He notes some difficulty breathing when he lays down for about 5 minutes but then this goes away.  He denies any peripheral edema.  Has had no signs or symptoms of stroke.   Past Medical History:  Diagnosis Date   Abdominal pain    ADHD    Allergic rhinitis    Anxiety    Delayed sleep phase syndrome    Dermatographism    Dizziness    Flat feet, bilateral    Inattention    Keratosis    Major depression    Migraine without aura and without status migrainosus, not intractable    Vitamin D deficiency    Vitiligo     Past Surgical History:  Procedure  Laterality Date   TONSILLECTOMY      Current Medications: Current Meds  Medication Sig   cetirizine (ZYRTEC) 10 MG tablet Take 10 mg by mouth daily.   fluticasone (FLONASE) 50 MCG/ACT nasal spray Place 1 spray into both nostrils daily.   ibuprofen (ADVIL,MOTRIN) 200 MG tablet Take 200 mg by mouth every 6 (six) hours as needed.   omeprazole (PRILOSEC) 40 MG capsule Take 1 capsule (40 mg total) by mouth daily.   sertraline (ZOLOFT) 100 MG tablet Take 100 mg by mouth daily.   [DISCONTINUED] methylphenidate (METADATE CD) 20 MG CR capsule    [DISCONTINUED] methylphenidate 18 MG PO CR tablet Take 18 mg by mouth every morning.   Current Facility-Administered Medications for the 05/13/21 encounter (Office Visit) with Early Osmond, MD  Medication   0.9 %  sodium chloride infusion     Allergies:    Allergies  Allergen Reactions   Albuterol     Social History   Tobacco Use   Smoking status: Never   Smokeless tobacco: Never  Vaping Use   Vaping Use: Every day  Substance Use Topics   Alcohol use: No   Drug use: No     Family History: Family History  Problem Relation Age of Onset   Celiac disease Neg Hx    Ulcers Neg Hx    Thyroid disease Mother  Cancer Maternal Grandmother        breast   Thyroid disease Maternal Grandmother    Diabetes Maternal Grandfather      ROS:   Please see the history of present illness.    All other systems reviewed and are negative.  EKGs/Labs/Other Studies Reviewed:    The following studies were reviewed today:  EKG: EKG from PCP office dictated as above; EKG today demonstrates sinus rhythm with incomplete right bundle branch block and possible left ventricular hypertrophy  Imaging studies that I have independently reviewed today: Previous EKGs demonstrating normal sinus rhythm with no acute ST changes or Q waves chest x-ray from August demonstrated no acute cardiopulmonary disease  Recent Labs: 02/13/2021: BUN 18; Creatinine, Ser  0.87; Hemoglobin 14.8; Platelets 246; Potassium 4.0; Sodium 139  Recent Lipid Panel No results found for: CHOL, TRIG, HDL, CHOLHDL, VLDL, LDLCALC, LDLDIRECT  Physical Exam:    VS:  BP 122/70   Pulse 73   Ht 5' 9.02" (1.753 m)   Wt 204 lb 9.6 oz (92.8 kg)   SpO2 98%   BMI 30.20 kg/m     Wt Readings from Last 5 Encounters:  05/13/21 204 lb 9.6 oz (92.8 kg) (94 %, Z= 1.53)*  03/15/21 200 lb (90.7 kg) (92 %, Z= 1.44)*  03/02/21 201 lb (91.2 kg) (93 %, Z= 1.46)*  02/25/21 200 lb (90.7 kg) (93 %, Z= 1.44)*  06/01/15 152 lb (68.9 kg) (94 %, Z= 1.55)*   * Growth percentiles are based on CDC (Boys, 2-20 Years) data.    GENERAL:  No apparent distress, AOx3 HEENT:  No carotid bruits, +2 carotid impulses, no scleral icterus CAR: RRR no murmurs, gallops, rubs, or thrills RES:  Clear to auscultation bilaterally ABD:  Soft, nontender, nondistended, positive bowel sounds x 4 VASC:  +2 radial pulses, +2 carotid pulses, palpable pedal pulses NEURO:  CN 2-12 grossly intact; motor and sensory grossly intact PSYCH:  No active depression or anxiety EXT:  No edema, ecchymosis, or cyanosis  ASSESSMENT:    1. Palpitations    PLAN:    Palpitations  I will obtain a 3-day monitor.  Patient's palpitations come on every 3 hours so we should be able to detect what this might be.  I will check a TSH, CBC, and CMP.  I will obtain an echocardiogram.  Further recommendations will follow results of this testing.  If a malignant arrhythmia is detected we will treat accordingly.  Otherwise we will keep follow-up with me open-ended   Shared Decision Making/Informed Consent:       Medication Adjustments/Labs and Tests Ordered: Current medicines are reviewed at length with the patient today.  Concerns regarding medicines are outlined above.   Orders Placed This Encounter  Procedures   TSH   Comprehensive metabolic panel   CBC   LONG TERM MONITOR (3-14 DAYS)   EKG 12-Lead   ECHOCARDIOGRAM COMPLETE      No orders of the defined types were placed in this encounter.   There are no Patient Instructions on file for this visit.

## 2021-05-13 NOTE — Progress Notes (Unsigned)
Applied a 3 day Zio XT monitor to patient in the office 

## 2021-05-14 ENCOUNTER — Encounter: Payer: Self-pay | Admitting: Internal Medicine

## 2021-05-14 LAB — COMPREHENSIVE METABOLIC PANEL
ALT: 15 IU/L (ref 0–44)
AST: 17 IU/L (ref 0–40)
Albumin/Globulin Ratio: 2.5 — ABNORMAL HIGH (ref 1.2–2.2)
Albumin: 5.2 g/dL (ref 4.1–5.2)
Alkaline Phosphatase: 87 IU/L (ref 51–125)
BUN/Creatinine Ratio: 22 — ABNORMAL HIGH (ref 9–20)
BUN: 17 mg/dL (ref 6–20)
Bilirubin Total: <0.2 mg/dL (ref 0.0–1.2)
CO2: 23 mmol/L (ref 20–29)
Calcium: 9.7 mg/dL (ref 8.7–10.2)
Chloride: 102 mmol/L (ref 96–106)
Creatinine, Ser: 0.78 mg/dL (ref 0.76–1.27)
Globulin, Total: 2.1 g/dL (ref 1.5–4.5)
Glucose: 93 mg/dL (ref 70–99)
Potassium: 3.9 mmol/L (ref 3.5–5.2)
Sodium: 140 mmol/L (ref 134–144)
Total Protein: 7.3 g/dL (ref 6.0–8.5)
eGFR: 132 mL/min/{1.73_m2} (ref >59)

## 2021-05-14 LAB — CBC
Hematocrit: 46.4 % (ref 37.5–51.0)
Hemoglobin: 15.1 g/dL (ref 13.0–17.7)
MCH: 29.2 pg (ref 26.6–33.0)
MCHC: 32.5 g/dL (ref 31.5–35.7)
MCV: 90 fL (ref 79–97)
Platelets: 274 10*3/uL (ref 150–450)
RBC: 5.17 x10E6/uL (ref 4.14–5.80)
RDW: 12.8 % (ref 11.6–15.4)
WBC: 6.9 10*3/uL (ref 3.4–10.8)

## 2021-05-14 LAB — TSH: TSH: 3.56 u[IU]/mL (ref 0.450–4.500)

## 2021-05-24 ENCOUNTER — Encounter: Payer: Self-pay | Admitting: Internal Medicine

## 2021-06-06 ENCOUNTER — Other Ambulatory Visit: Payer: Self-pay

## 2021-06-06 ENCOUNTER — Ambulatory Visit (HOSPITAL_COMMUNITY): Payer: 59 | Attending: Cardiovascular Disease

## 2021-06-06 DIAGNOSIS — R002 Palpitations: Secondary | ICD-10-CM | POA: Insufficient documentation

## 2021-06-06 LAB — ECHOCARDIOGRAM COMPLETE
Area-P 1/2: 2.77 cm2
S' Lateral: 3.3 cm

## 2021-06-09 ENCOUNTER — Telehealth: Payer: Self-pay | Admitting: Internal Medicine

## 2021-06-09 NOTE — Telephone Encounter (Signed)
Follow Up:     Patient's Mother wou;d like for somebody to call and go his Echo and Monitor results. She did not understand it.

## 2021-06-09 NOTE — Telephone Encounter (Signed)
Spoke w patient's mom (DPR) and reviewed echo and monitor.  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  She is aware I will forward to Dr. Ali Lowe to review and provide further information for her concerns.  She is aware that we will give her a call back and she can review information on the patient portal as well.

## 2021-06-10 NOTE — Telephone Encounter (Signed)
Reply from Dr. Ali Lowe: EF 50-55% is pretty normal and measuring it is not exact.  It looks fine.    Having slow heart beats or blocks during sleep are normal during sleep and not anything to worry about.      Reviewed with patient's mother, (DPR).  She voices understanding and appreciation for the information.  Forwarded results of echo, labs and monitor to Caren Macadam, MD at [facility].  This is the patient's PCP now and he has an upcoming appointment.

## 2021-07-03 ENCOUNTER — Other Ambulatory Visit: Payer: Self-pay | Admitting: Gastroenterology

## 2021-09-13 ENCOUNTER — Encounter: Payer: Self-pay | Admitting: Neurology

## 2021-11-01 NOTE — Progress Notes (Deleted)
NEUROLOGY CONSULTATION NOTE  ZAKHARI FOGEL MRN: [id number] DOB: 13-Mar-2001  Referring provider: Caren Macadam, MD Primary care provider: Benjiman Core, PA-C  Reason for consult:  headache  Assessment/Plan:   ***   Subjective:  Garrett Booker is a 21 year old male with ADHD and anxiety who presents for headaches.  History supplemented by referring provider's note.  Onset:  *** Location:  *** Quality:  throbbing Intensity:  ***.  Does not awaken him at night. Aura:  *** Prodrome:  *** Postdrome:  *** Associated symptoms:  photophobia, dizziness, rarely double vision.  He denies associated nausea, vomiting, slurred speech, unilateral numbness or weakness. Duration:  *** Frequency:  *** Frequency of abortive medication: *** Triggers:  *** Relieving factors:  *** Activity:  ***  Routine eye exam stable. Prior CT head on 02/13/2021 personally reviewed was normal.  Current NSAIDS/analgesics:  ibuprofen Current triptans:  none Current ergotamine:  none Current anti-emetic:  none Current muscle relaxants:  none Current Antihypertensive medications:  none Current Antidepressant medications:  sertraline '100mg'$ , Abilify  Current Anticonvulsant medications:  none Current anti-CGRP:  none Current Vitamins/Herbal/Supplements:  none Current Antihistamines/Decongestants:  Zyrtec, Flolnase Other therapy:  *** Hormone/birth control:  none Other medications:  hydroxyzine  Past NSAIDS/analgesics:  *** Past abortive triptans:  *** Past abortive ergotamine:  *** Past muscle relaxants:  *** Past anti-emetic:  *** Past antihypertensive medications:  *** Past antidepressant medications:  *** Past anticonvulsant medications:  *** Past anti-CGRP:  *** Past vitamins/Herbal/Supplements:  *** Past antihistamines/decongestants:  *** Other past therapies:  ***  Caffeine:  *** Alcohol:  *** Smoker:  *** Diet:  *** Exercise:  *** Depression:  ***; Anxiety:  *** Other  pain:  *** Sleep hygiene:  *** Family history of headache:  ***      PAST MEDICAL HISTORY: Past Medical History:  Diagnosis Date   Abdominal pain    ADHD    Allergic rhinitis    Anxiety    Delayed sleep phase syndrome    Dermatographism    Dizziness    Flat feet, bilateral    Inattention    Keratosis    Major depression    Migraine without aura and without status migrainosus, not intractable    Vitamin D deficiency    Vitiligo     PAST SURGICAL HISTORY: Past Surgical History:  Procedure Laterality Date   TONSILLECTOMY      MEDICATIONS: Current Outpatient Medications on File Prior to Visit  Medication Sig Dispense Refill   ARIPiprazole (ABILIFY) 2 MG tablet Take 3 mg by mouth daily.     cetirizine (ZYRTEC) 10 MG tablet Take 10 mg by mouth daily.     fluticasone (FLONASE) 50 MCG/ACT nasal spray Place 1 spray into both nostrils daily.     ibuprofen (ADVIL,MOTRIN) 200 MG tablet Take 200 mg by mouth every 6 (six) hours as needed.     omeprazole (PRILOSEC) 40 MG capsule TAKE 1 CAPSULE(40 MG) BY MOUTH DAILY 30 capsule 1   sertraline (ZOLOFT) 100 MG tablet Take 100 mg by mouth daily.     Current Facility-Administered Medications on File Prior to Visit  Medication Dose Route Frequency Provider Last Rate Last Admin   0.9 %  sodium chloride infusion  500 mL Intravenous Once Daryel November, MD        ALLERGIES: Allergies  Allergen Reactions   Albuterol     FAMILY HISTORY: Family History  Problem Relation Age of Onset   Celiac disease Neg Hx  Ulcers Neg Hx    Thyroid disease Mother    Cancer Maternal Grandmother        breast   Thyroid disease Maternal Grandmother    Diabetes Maternal Grandfather     Objective:  *** General: No acute distress.  Patient appears well-groomed.   Head:  Normocephalic/atraumatic Eyes:  fundi examined but not visualized Neck: supple, no paraspinal tenderness, full range of motion Back: No paraspinal tenderness Heart:  regular rate and rhythm Lungs: Clear to auscultation bilaterally. Vascular: No carotid bruits. Neurological Exam: Mental status: alert and oriented to person, place, and time, recent and remote memory intact, fund of knowledge intact, attention and concentration intact, speech fluent and not dysarthric, language intact. Cranial nerves: CN I: not tested CN II: pupils equal, round and reactive to light, visual fields intact CN III, IV, VI:  full range of motion, no nystagmus, no ptosis CN V: facial sensation intact. CN VII: upper and lower face symmetric CN VIII: hearing intact CN IX, X: gag intact, uvula midline CN XI: sternocleidomastoid and trapezius muscles intact CN XII: tongue midline Bulk & Tone: normal, no fasciculations. Motor:  muscle strength 5/5 throughout Sensation:  Pinprick, temperature and vibratory sensation intact. Deep Tendon Reflexes:  2+ throughout,  toes downgoing.   Finger to nose testing:  Without dysmetria.   Heel to shin:  Without dysmetria.   Gait:  Normal station and stride.  Romberg negative.    Thank you for allowing me to take part in the care of this patient.  Metta Clines, DO  CC: ***

## 2021-11-02 ENCOUNTER — Ambulatory Visit: Payer: 59 | Admitting: Neurology

## 2021-12-30 ENCOUNTER — Ambulatory Visit: Payer: 59 | Admitting: Neurology

## 2022-01-15 ENCOUNTER — Other Ambulatory Visit: Payer: Self-pay

## 2022-01-15 ENCOUNTER — Emergency Department (HOSPITAL_BASED_OUTPATIENT_CLINIC_OR_DEPARTMENT_OTHER)
Admission: EM | Admit: 2022-01-15 | Discharge: 2022-01-15 | Disposition: A | Discharge status: Home / Self Care | Payer: 59 | Attending: Emergency Medicine | Admitting: Emergency Medicine

## 2022-01-15 ENCOUNTER — Encounter (HOSPITAL_BASED_OUTPATIENT_CLINIC_OR_DEPARTMENT_OTHER): Payer: Self-pay | Admitting: Emergency Medicine

## 2022-01-15 DIAGNOSIS — H60552 Acute reactive otitis externa, left ear: Secondary | ICD-10-CM | POA: Insufficient documentation

## 2022-01-15 DIAGNOSIS — H9202 Otalgia, left ear: Secondary | ICD-10-CM | POA: Diagnosis present

## 2022-01-15 DIAGNOSIS — H60392 Other infective otitis externa, left ear: Secondary | ICD-10-CM

## 2022-01-15 MED ORDER — NEOMYCIN-POLYMYXIN-HC 3.5-10000-1 OT SUSP
4.0000 [drp] | Freq: Three times a day (TID) | OTIC | 0 refills | Status: DC
Start: 2022-01-15 — End: 2022-01-16

## 2022-01-15 NOTE — ED Notes (Signed)
Pt from home wit left ear pain, esp when swallowing, since yesterday. States he also has minor sore throat. Denies n/v/fevers at home. Pt alert & oriented, nad noted.

## 2022-01-15 NOTE — Discharge Instructions (Addendum)
Try drops as directed for the next 5 days and see a clinician if no improvement or worsening signs or symptoms. Occasionally this can be other types of infection that would need different treatment (ie. Herpes Zoster) Use Tylenol every 4 hours and ibuprofen every 6 hours as needed for pain or fevers.

## 2022-01-15 NOTE — ED Triage Notes (Signed)
Left ear pain x 1 days. Worse when swallowing

## 2022-01-15 NOTE — ED Provider Notes (Signed)
[facility] DEPT Provider Note   CSN: [id number] Arrival date & time: 01/15/22  1304     History  Chief Complaint  Patient presents with   Otalgia    Garrett Booker is a 21 y.o. male.  Patient presents with left ear pain at times worse with swallowing.  No sore throat.  No fevers or chills.  No history of similar.  No recent injuries or swimming.  Vaccines up-to-date no active medical problems.       Home Medications Prior to Admission medications   Medication Sig Start Date End Date Taking? Authorizing Provider  neomycin-polymyxin-hydrocortisone (CORTISPORIN) 3.5-10000-1 OTIC suspension Place 4 drops into the left ear 3 (three) times daily for 5 days. 01/15/22 01/20/22 Yes Elnora Morrison, MD  ARIPiprazole (ABILIFY) 2 MG tablet Take 3 mg by mouth daily.    [provider]  cetirizine (ZYRTEC) 10 MG tablet Take 10 mg by mouth daily.    [provider]  fluticasone (FLONASE) 50 MCG/ACT nasal spray Place 1 spray into both nostrils daily.    [provider]  ibuprofen (ADVIL,MOTRIN) 200 MG tablet Take 200 mg by mouth every 6 (six) hours as needed.    [provider]  omeprazole (PRILOSEC) 40 MG capsule TAKE 1 CAPSULE(40 MG) BY MOUTH DAILY 07/05/21   Daryel November, MD  sertraline (ZOLOFT) 100 MG tablet Take 100 mg by mouth daily. 01/30/21   [provider]      Allergies    Albuterol    Review of Systems   Review of Systems  Constitutional:  Negative for chills and fever.  HENT:  Negative for congestion.   Eyes:  Negative for visual disturbance.  Respiratory:  Negative for shortness of breath.   Cardiovascular:  Negative for chest pain.  Gastrointestinal:  Negative for abdominal pain and vomiting.  Genitourinary:  Negative for dysuria and flank pain.  Musculoskeletal:  Negative for back pain, neck pain and neck stiffness.  Skin:  Negative for rash.  Neurological:  Negative for light-headedness and  headaches.    Physical Exam Updated Vital Signs BP 140/66 (BP Location: Right Arm)   Pulse 66   Temp 98.1 F (36.7 C)   Resp 18   SpO2 97%  Physical Exam Vitals and nursing note reviewed.  Constitutional:      General: He is not in acute distress.    Appearance: He is well-developed.  HENT:     Head: Normocephalic and atraumatic.     Comments: Patient has mild irritation left external auditory canal with minimal erythema tympanic membrane, no definitive ulcerations or vesicles seen.  No drainage or perforation.  No significant lymphadenopathy no trismus.  No signs of posterior edema or abscess pharynx no exudate.    Mouth/Throat:     Mouth: Mucous membranes are moist.  Eyes:     General:        Right eye: No discharge.        Left eye: No discharge.     Conjunctiva/sclera: Conjunctivae normal.  Neck:     Trachea: No tracheal deviation.  Cardiovascular:     Rate and Rhythm: Normal rate.  Pulmonary:     Effort: Pulmonary effort is normal.  Abdominal:     General: There is no distension.  Musculoskeletal:     Cervical back: Normal range of motion and neck supple. No rigidity.  Skin:    General: Skin is warm.     Capillary Refill: Capillary refill takes less than 2  seconds.  Neurological:     General: No focal deficit present.     Mental Status: He is alert.     Cranial Nerves: No cranial nerve deficit.  Psychiatric:        Mood and Affect: Mood normal.     ED Results / Procedures / Treatments   Labs (all labs ordered are listed, but only abnormal results are displayed) Labs Reviewed - No data to display  EKG None  Radiology No results found.  Procedures Procedures    Medications Ordered in ED Medications - No data to display  ED Course/ Medical Decision Making/ A&P                           Medical Decision Making Risk Prescription drug management.   Patient presents with mild left ear pain discussed mild otitis externa and basic drops to try for  a week and follow-up.  No concern for zoster or other infection at this time however follow-up discussed with patient.  No concern for strep pharyngitis or PTA at this time.        Final Clinical Impression(s) / ED Diagnoses Final diagnoses:  Acute infective otitis externa, left    Rx / DC Orders ED Discharge Orders          Ordered    neomycin-polymyxin-hydrocortisone (CORTISPORIN) 3.5-10000-1 OTIC suspension  3 times daily        01/15/22 1559              Elnora Morrison, MD 01/15/22 1603

## 2022-01-15 NOTE — ED Notes (Signed)
Pt discharged home after verbalizing understanding of discharge instructions; nad noted. 

## 2022-01-16 ENCOUNTER — Telehealth (HOSPITAL_BASED_OUTPATIENT_CLINIC_OR_DEPARTMENT_OTHER): Payer: Self-pay | Admitting: Emergency Medicine

## 2022-01-16 MED ORDER — NEOMYCIN-POLYMYXIN-HC 3.5-10000-1 OT SUSP: 4.0000 [drp] | Freq: Three times a day (TID) | OTIC | 0 refills | Status: AC

## 2022-01-16 NOTE — Telephone Encounter (Signed)
Eardrops not received by pharmacy.  Will resend.

## 2022-03-02 NOTE — Progress Notes (Signed)
NEUROLOGY CONSULTATION NOTE  Garrett Booker MRN: [id number] DOB: 2000-11-15  Referring provider: Caren Macadam, MD Primary care provider: Benjiman Core, PA  Reason for consult:  headaches  Assessment/Plan:   Migraine without aura, without status migrainosus, not intractable  Migraine prevention:  Start Aimovig '140mg'$  every 28 days.  He has already tried topiramate.  He is already on antidepressant.  Would not prescribe beta blocker as it may aggravate depression. Migraine rescue:  Rizatriptan '10mg'$ , Zofran '4mg'$  if needed Limit use of pain relievers to no more than 2 days out of week to prevent risk of rebound or medication-overuse headache. Keep headache diary Follow up 4 months    Subjective:  Garrett Booker is a 21 year old male with ADHD who presents for migraines.  History supplemented by primary care note.   Onset:  2022.  Location:  usually back/crown, occasionally forehead.  Quality:  sharp, pressure.  Intensity:  severe.  Associated symptoms:  Photophobia, phonophobia, mild blurred vision.  Sometimes nausea, photophobia and dizziness.  Denies vomiting, or unilateral numbness or weakness.  Duration:  all day.  Frequency:  Varies.  May have 3 weeks of daily headaches every  other month, other times may have 1 or 2 in a week.  Preceding aura:  absent.  Prodrome:  absent.  Triggers:  unknown.  Relieving factors:  resting in dark room.  Activity:  aggravates  Formal eye exam was unremarkable.   Seen by cardiology in 2022 for dizziness and palpitations.  Echo and cardiac event monitor were unremarkable.   CT head on 02/13/2021 personally reviewed was normal.  Past NSAIDS/analgesics:  Fioricet, ibuprofen, naproxen Past abortive triptans:  none Past abortive ergotamine:  none Past muscle relaxants:  none Past anti-emetic:  Zofran Past antihypertensive medications:  none Past antidepressant medications:  none Past anticonvulsant medications:  topiramate Past anti-CGRP:   none Past vitamins/Herbal/Supplements:  none Past antihistamines/decongestants:  none Other past therapies:  none  Rescue protocol:  Tylenol, Goody Current NSAIDS/analgesics: Tylenol, Goody Current triptans:  none Current ergotamine:  none Current anti-emetic:  none Current muscle relaxants:  none Current Antihypertensive medications:  none Current Antidepressant medications:  sertraline '100mg'$  daily Current Anticonvulsant medications:  lamotrigine '50mg'$  daily Current anti-CGRP:  none Current Vitamins/Herbal/Supplements:  none Current Antihistamines/Decongestants:  Zyrtec, Flonase Other therapy:  none Hormone/birth control:  none Other medications:  Abilify    Caffeine:  1 cup coffee in morning Exercise:  30-45 minutes cardio daily Depression:  stable; Anxiety:  stable Sleep hygiene:  varies Family history of headache:  mom      PAST MEDICAL HISTORY: Past Medical History:  Diagnosis Date   Abdominal pain    ADHD    Allergic rhinitis    Anxiety    Delayed sleep phase syndrome    Dermatographism    Dizziness    Flat feet, bilateral    Inattention    Keratosis    Major depression    Migraine without aura and without status migrainosus, not intractable    Vitamin D deficiency    Vitiligo     PAST SURGICAL HISTORY: Past Surgical History:  Procedure Laterality Date   TONSILLECTOMY      MEDICATIONS: Current Outpatient Medications on File Prior to Visit  Medication Sig Dispense Refill   ARIPiprazole (ABILIFY) 2 MG tablet Take 3 mg by mouth daily.     cetirizine (ZYRTEC) 10 MG tablet Take 10 mg by mouth daily.     fluticasone (FLONASE) 50 MCG/ACT nasal spray Place 1  spray into both nostrils daily.     ibuprofen (ADVIL,MOTRIN) 200 MG tablet Take 200 mg by mouth every 6 (six) hours as needed.     omeprazole (PRILOSEC) 40 MG capsule TAKE 1 CAPSULE(40 MG) BY MOUTH DAILY 30 capsule 1   sertraline (ZOLOFT) 100 MG tablet Take 100 mg by mouth daily.     Current  Facility-Administered Medications on File Prior to Visit  Medication Dose Route Frequency Provider Last Rate Last Admin   0.9 %  sodium chloride infusion  500 mL Intravenous Once Daryel November, MD        ALLERGIES: Allergies  Allergen Reactions   Albuterol     FAMILY HISTORY: Family History  Problem Relation Age of Onset   Celiac disease Neg Hx    Ulcers Neg Hx    Thyroid disease Mother    Cancer Maternal Grandmother        breast   Thyroid disease Maternal Grandmother    Diabetes Maternal Grandfather     Objective:  Blood pressure 127/77, pulse 82, weight 242 lb (109.8 kg), SpO2 97 %. General: No acute distress.  Patient appears well-groomed.   Head:  Normocephalic/atraumatic Eyes:  fundi examined but not visualized Neck: supple, no paraspinal tenderness, full range of motion Back: No paraspinal tenderness Heart: regular rate and rhythm Lungs: Clear to auscultation bilaterally. Vascular: No carotid bruits. Neurological Exam: Mental status: alert and oriented to person, place, and time, speech fluent and not dysarthric, language intact. Cranial nerves: CN I: not tested CN II: pupils equal, round and reactive to light, visual fields intact CN III, IV, VI:  full range of motion, no nystagmus, no ptosis CN V: facial sensation intact. CN VII: upper and lower face symmetric CN VIII: hearing intact CN IX, X: gag intact, uvula midline CN XI: sternocleidomastoid and trapezius muscles intact CN XII: tongue midline Bulk & Tone: normal, no fasciculations. Motor:  muscle strength 5/5 throughout Sensation:  Pinprick, temperature and vibratory sensation intact. Deep Tendon Reflexes:  2+ throughout,  toes downgoing.   Finger to nose testing:  Without dysmetria.   Heel to shin:  Without dysmetria.   Gait:  Normal station and stride.  Romberg negative.    Thank you for allowing me to take part in the care of this patient.  Metta Clines, DO  CC: Benjiman Core,  PA-C

## 2022-03-07 ENCOUNTER — Encounter: Payer: Self-pay | Admitting: Neurology

## 2022-03-07 ENCOUNTER — Telehealth (HOSPITAL_COMMUNITY): Payer: Self-pay | Admitting: Pharmacy Technician

## 2022-03-07 ENCOUNTER — Ambulatory Visit: Payer: 59 | Admitting: Neurology

## 2022-03-07 VITALS — BP 127/77 | HR 82 | Wt 242.0 lb

## 2022-03-07 DIAGNOSIS — G43009 Migraine without aura, not intractable, without status migrainosus: Secondary | ICD-10-CM | POA: Diagnosis not present

## 2022-03-07 MED ORDER — AIMOVIG 140 MG/ML ~~LOC~~ SOAJ: 140.0000 mg | SUBCUTANEOUS | 11 refills | Status: DC

## 2022-03-07 MED ORDER — RIZATRIPTAN BENZOATE 10 MG PO TABS: 10.0000 mg | ORAL_TABLET | ORAL | 5 refills | Status: DC | PRN

## 2022-03-07 MED ORDER — ONDANSETRON HCL 4 MG PO TABS: 4.0000 mg | ORAL_TABLET | Freq: Three times a day (TID) | ORAL | 5 refills | Status: DC | PRN

## 2022-03-07 NOTE — Telephone Encounter (Addendum)
Patient Advocate Encounter   Received notification that prior authorization for Aimovig '140MG'$ /ML auto-injectors is required.   PA submitted on 03/07/2022 Key   [id number] Status is pending       Lyndel Safe, [place] Patient Advocate Specialist [place] Patient Advocate Team Direct Number: [phone]  Fax: [phone]

## 2022-03-07 NOTE — Patient Instructions (Signed)
  Start Aimovig '140mg'$  injection every 28 days.  Take rizatriptan '10mg'$  at earliest onset of headache.  May repeat dose once in 2 hours if needed.  Maximum 2 tablets in 24 hours. Limit use of pain relievers to no more than 2 days out of the week.  These medications include acetaminophen, NSAIDs (ibuprofen/Advil/Motrin, naproxen/Aleve, triptans (Imitrex/sumatriptan), Excedrin, and narcotics.  This will help reduce risk of rebound headaches. Be aware of common food triggers:  - Caffeine:  coffee, black tea, cola, Mt. Dew  - Chocolate  - Dairy:  aged cheeses (brie, blue, cheddar, gouda, [place], provolone, [place], Swiss, etc), chocolate milk, buttermilk, sour cream, limit eggs and yogurt  - Nuts, peanut butter  - Alcohol  - Cereals/grains:  FRESH breads (fresh bagels, sourdough, doughnuts), yeast productions  - Processed/canned/aged/cured meats (pre-packaged deli meats, hotdogs)  - MSG/glutamate:  soy sauce, flavor enhancer, pickled/preserved/marinated foods  - Sweeteners:  aspartame (Equal, Nutrasweet).  Sugar and Splenda are okay  - Vegetables:  legumes (lima beans, lentils, snow peas, fava beans, pinto peans, peas, garbanzo beans), sauerkraut, onions, olives, pickles  - Fruit:  avocados, bananas, citrus fruit (orange, lemon, grapefruit), mango  - Other:  Frozen meals, macaroni and cheese Routine exercise Stay adequately hydrated (aim for 64 oz water daily) Keep headache diary Maintain proper stress management Maintain proper sleep hygiene Do not skip meals Consider supplements:  magnesium citrate '400mg'$  daily, riboflavin '400mg'$  daily, coenzyme Q10 '100mg'$  three times daily.

## 2022-03-10 NOTE — Telephone Encounter (Signed)
Patient Advocate Encounter  Prior Authorization for Aimovig '140MG'$ /ML auto-injectors has been approved.    PA# [id number] Effective dates: 03/10/2022 through 09/08/2022      Lyndel Safe, [place] Patient Advocate Specialist [place] Patient Advocate Team Direct Number: [phone]  Fax: [phone]

## 2022-03-17 ENCOUNTER — Encounter: Payer: Self-pay | Admitting: Neurology

## 2022-03-17 ENCOUNTER — Other Ambulatory Visit: Payer: Self-pay | Admitting: Neurology

## 2022-03-17 MED ORDER — PROPRANOLOL HCL ER 60 MG PO CP24: 60.0000 mg | ORAL_CAPSULE | Freq: Every day | ORAL | 5 refills | Status: DC

## 2022-03-23 ENCOUNTER — Other Ambulatory Visit: Payer: Self-pay

## 2022-03-23 ENCOUNTER — Encounter (HOSPITAL_BASED_OUTPATIENT_CLINIC_OR_DEPARTMENT_OTHER): Payer: Self-pay | Admitting: Obstetrics and Gynecology

## 2022-03-23 ENCOUNTER — Emergency Department (HOSPITAL_BASED_OUTPATIENT_CLINIC_OR_DEPARTMENT_OTHER)
Admission: EM | Admit: 2022-03-23 | Discharge: 2022-03-23 | Disposition: A | Discharge status: Home / Self Care | Payer: 59 | Attending: Emergency Medicine | Admitting: Emergency Medicine

## 2022-03-23 DIAGNOSIS — R0789 Other chest pain: Secondary | ICD-10-CM | POA: Insufficient documentation

## 2022-03-23 DIAGNOSIS — R61 Generalized hyperhidrosis: Secondary | ICD-10-CM | POA: Insufficient documentation

## 2022-03-23 DIAGNOSIS — R42 Dizziness and giddiness: Secondary | ICD-10-CM | POA: Diagnosis present

## 2022-03-23 DIAGNOSIS — R1110 Vomiting, unspecified: Secondary | ICD-10-CM | POA: Insufficient documentation

## 2022-03-23 LAB — CBC
HCT: 43.5 % (ref 39.0–52.0)
Hemoglobin: 14.7 g/dL (ref 13.0–17.0)
MCH: 29.1 pg (ref 26.0–34.0)
MCHC: 33.8 g/dL (ref 30.0–36.0)
MCV: 86.1 fL (ref 80.0–100.0)
Platelets: 247 10*3/uL (ref 150–400)
RBC: 5.05 MIL/uL (ref 4.22–5.81)
RDW: 12 % (ref 11.5–15.5)
WBC: 4.4 10*3/uL (ref 4.0–10.5)
nRBC: 0 % (ref 0.0–0.2)

## 2022-03-23 LAB — BASIC METABOLIC PANEL
Anion gap: 10 (ref 5–15)
BUN: 14 mg/dL (ref 6–20)
CO2: 28 mmol/L (ref 22–32)
Calcium: 9.5 mg/dL (ref 8.9–10.3)
Chloride: 102 mmol/L (ref 98–111)
Creatinine, Ser: 0.99 mg/dL (ref 0.61–1.24)
GFR, Estimated: >60 mL/min (ref >60)
Glucose, Bld: 95 mg/dL (ref 70–99)
Potassium: 4.1 mmol/L (ref 3.5–5.1)
Sodium: 140 mmol/L (ref 135–145)

## 2022-03-23 LAB — URINALYSIS, ROUTINE W REFLEX MICROSCOPIC
Bilirubin Urine: NEGATIVE
Glucose, UA: NEGATIVE mg/dL
Hgb urine dipstick: NEGATIVE
Ketones, ur: NEGATIVE mg/dL
Nitrite: NEGATIVE
Specific Gravity, Urine: 1.026 (ref 1.005–1.030)
pH: 7 (ref 5.0–8.0)

## 2022-03-23 MED ORDER — MECLIZINE HCL 25 MG PO TABS: 25.0000 mg | ORAL_TABLET | Freq: Three times a day (TID) | ORAL | 0 refills | Status: DC | PRN

## 2022-03-23 NOTE — Discharge Instructions (Signed)
Thank you for coming to [facility] Emergency Department. You were seen for dizziness or vertigo. We did an exam, labs, and imaging, and these showed no acute findings.  Please follow up with your primary care provider within 1 week.  We have prescribed to you meclizine, which can be taken as needed for dizziness up to 3 times per day. Please stay well hydrated.   Do not hesitate to return to the ED or call 911 if you experience: -Worsening symptoms -Lightheadedness, passing out -Changes to your hearing or vision -Numbness, tingling, or weakness on one side -Severe headache -Falls, head trauma -Fevers/chills -Anything else that concerns you

## 2022-03-23 NOTE — ED Provider Notes (Signed)
[facility] Provider Note   CSN: [id number] Arrival date & time: 03/23/22  1159     History  Chief Complaint  Patient presents with   Dizziness    Garrett Booker is a 21 y.o. male with vitiligo, h/o dizziness, constipation presents with dizziness.  Patient p/w room-spinning dizziness x4 days, occurring intermittently every few hours, even if he is lying down. Reports this morning he had an episode of diaphoresis and chest tightness and wanted to be evaluated, has no history of that. Has a history of dizziness related to he thought dehydration, but this is different than that. When the dizziness occurs he hasn't felt nauseated. Did have an episode of vomiting today after bloodwork was done but doesn't feel nauseated anymore. No f/c, recent illnesses, changes in hearing, ear pain, tinnitus, recent chiropractic manipulation, neck pain, palpitations, SOB, leg swelling. Still feels very slight chest tightness but it is mostly gone at this time.    Dizziness      Home Medications Prior to Admission medications   Medication Sig Start Date End Date Taking? Authorizing Provider  meclizine (ANTIVERT) 25 MG tablet Take 1 tablet (25 mg total) by mouth 3 (three) times daily as needed for dizziness. 03/23/22  Yes Audley Hose, MD  ARIPiprazole (ABILIFY) 2 MG tablet Take 3 mg by mouth daily.    [provider]  cetirizine (ZYRTEC) 10 MG tablet Take 10 mg by mouth daily.    [provider]  fluticasone (FLONASE) 50 MCG/ACT nasal spray Place 1 spray into both nostrils daily.    [provider]  ibuprofen (ADVIL,MOTRIN) 200 MG tablet Take 200 mg by mouth every 6 (six) hours as needed. Patient not taking: Reported on 03/07/2022    [provider]  lamoTRIgine (LAMICTAL) 25 MG tablet Take 50 mg by mouth daily. 03/02/22   [provider]  omeprazole (PRILOSEC) 40 MG capsule TAKE 1 CAPSULE(40 MG) BY MOUTH DAILY Patient  not taking: Reported on 03/07/2022 07/05/21   Daryel November, MD  ondansetron (ZOFRAN) 4 MG tablet Take 1 tablet (4 mg total) by mouth every 8 (eight) hours as needed for nausea or vomiting. 03/07/22   Pieter Partridge, DO  propranolol ER (INDERAL LA) 60 MG 24 hr capsule Take 1 capsule (60 mg total) by mouth daily. 03/17/22   Pieter Partridge, DO  rizatriptan (MAXALT) 10 MG tablet Take 1 tablet (10 mg total) by mouth as needed for migraine. May repeat in 2 hours if needed.  Maximum 2 tablets in 24 hours. 03/07/22   Pieter Partridge, DO  sertraline (ZOLOFT) 100 MG tablet Take 100 mg by mouth daily. 01/30/21   [provider]      Allergies    Albuterol    Review of Systems   Review of Systems  Neurological:  Positive for dizziness.   Review of systems negative for hearing changes.  A 10 point review of systems was performed and is negative unless otherwise reported in HPI.  Physical Exam Updated Vital Signs BP 121/69   Pulse 68   Temp 98.9 F (37.2 C)   Resp 19   Ht '6\' 3"'$  (1.905 m)   Wt 104.3 kg   SpO2 100%   BMI 28.75 kg/m  Physical Exam General: Normal appearing male, lying in bed.  HEENT: PERRLA, EOMI, no nystagmus, Sclera anicteric, MMM, trachea midline. Cardiology: RRR, no murmurs/rubs/gallops. BL radial and DP pulses equal bilaterally.  Resp: Normal respiratory rate and effort. CTAB,  no wheezes, rhonchi, crackles.  Abd: Soft, non-tender, non-distended. No rebound tenderness or guarding.  GU: Deferred. MSK: No peripheral edema or signs of trauma. Extremities without deformity or TTP. No cyanosis or clubbing. Skin: warm, dry. No rashes or lesions. Back: No CVA tenderness Neuro: A&Ox4, CNs II-XII grossly intact. MAEs. Sensation grossly intact.  Psych: Normal mood and affect.   ED Results / Procedures / Treatments   Labs (all labs ordered are listed, but only abnormal results are displayed) Labs Reviewed  URINALYSIS, ROUTINE W REFLEX MICROSCOPIC - Abnormal; Notable for the  following components:      Result Value   Protein, ur TRACE (*)    Leukocytes,Ua TRACE (*)    Bacteria, UA RARE (*)    All other components within normal limits  BASIC METABOLIC PANEL  CBC    EKG EKG Interpretation  Date/Time:  Thursday March 23 2022 12:07:28 EDT Ventricular Rate:  76 PR Interval:  160 QRS Duration: 96 QT Interval:  358 QTC Calculation: 402 R Axis:   78 Text Interpretation: Normal sinus rhythm Normal ECG When compared with ECG of 13-Feb-2021 10:25, PREVIOUS ECG IS PRESENT Confirmed by Cindee Lame [phone]) on 03/23/2022 1:08:02 PM  Radiology No results found.  Procedures Procedures    Medications Ordered in ED Medications - No data to display  ED Course/ Medical Decision Making/ A&P                          Medical Decision Making Amount and/or Complexity of Data Reviewed Labs: ordered.    Patient extremely well-appearing, currently asymptomatic, just came in to be "checked out." Very low concern for acute life threatening cause of symptoms such as stroke (given age, no FNDs, normal gait, no nystagmus at all), brain tumor/ICH given no HAs or FNDs), ACS/carotid artery dissection/aortic dissection given symptoms resolved spontaneously. Consider arrhythmia and EKG NSR w/ normal intervals, no STEs/Ds. Consider electrolyte abnormality or hypoglycemia which are normal. Consider systemic viral illness causing symptoms, though he notes no sick contacts. No anemia or white count to indicate infection and urine does not demonstrate UTI. Patient likely with benign peripheral vertigo. Prescribed meclizine to take TID PRN for vertigo symptoms and instructed to f/u with PCP w/in 1 week. Discharged w/ discharge instructions and return precautions in stable condition. Patient reports understanding.    I have personally reviewed and interpreted all labs and imaging.   Clinical Course as of 03/28/22 0225  Thu Mar 23, 2022  1305 Unremarkable BMP, CBC [HN]     Clinical Course User Index [HN] Audley Hose, MD    Dispo: DC         Final Clinical Impression(s) / ED Diagnoses Final diagnoses:  Vertigo    Rx / DC Orders ED Discharge Orders          Ordered    meclizine (ANTIVERT) 25 MG tablet  3 times daily PRN        03/23/22 1352             This note was created using dictation software, which may contain spelling or grammatical errors.    Audley Hose, MD 03/28/22 [phone]

## 2022-03-23 NOTE — ED Notes (Signed)
MD at the Bedside. 

## 2022-03-23 NOTE — ED Triage Notes (Signed)
Patient reports to the ER for dizziness x4 days. Reports this morning he had an episode of diaphoresis and chest tightness and wanted to be evaluated. Patient reports he thought his dizziness was Vertigo and he has a hx of some mild dehydration related dizziness but never to this extent

## 2022-03-23 NOTE — ED Notes (Signed)
RN provided AVS using Teachback Method. Patient verbalizes understanding of Discharge Instructions. Opportunity for Questioning and Answers were provided by RN. Patient Discharged from ED ambulatory to Home via Self.  

## 2022-08-01 ENCOUNTER — Ambulatory Visit: Payer: 59 | Admitting: Neurology

## 2022-11-15 NOTE — Progress Notes (Signed)
NEUROLOGY FOLLOW UP OFFICE NOTE  Garrett Booker [id number].  Assessment/Plan:   Migraine without aura, without status migrainosus, not intractable   Migraine prevention:  Start Aimovig 140mg  every 28 days.  He has already tried topiramate.  He is already on antidepressant.  Would not prescribe beta blocker as it may aggravate depression. Migraine rescue:  Rizatriptan 10mg , Zofran 4mg  if needed Limit use of pain relievers to no more than 2 days out of week to prevent risk of rebound or medication-overuse headache. Keep headache diary Follow up 4 months       Subjective:  Garrett Booker is a 22 year old right-handed male with ADHD who follows up for migraine.  UPDATE: Plan was to start Aimovig, which was denied by his insurance.  Instead, he was started on propranolol  which didn't work well.  Started Aimovig.  Doing very well  Intensity:  moderate Duration:  1 hour with rizatriptan Frequency:  7 a month  Current NSAIDS/analgesics: none Current triptans:  rizatriptan 10mg  Current ergotamine:  none Current anti-emetic:  Zofran 4mg  Current muscle relaxants:  none Current Antihypertensive medications:  none Current Antidepressant medications:  sertraline 100mg  daily Current Anticonvulsant medications:  lamotrigine 50mg  daily Current anti-CGRP:  none Current Vitamins/Herbal/Supplements:  none Current Antihistamines/Decongestants:  Zyrtec, Flonase Other therapy:  none Hormone/birth control:  none Other medications:  Abilify      Caffeine:  1 cup coffee in morning Exercise:  30-45 minutes cardio daily Depression:  stable; Anxiety:  stable Sleep hygiene:  varies  HISTORY:  Onset:  2022.  Location:  usually back/crown, occasionally forehead.  Quality:  sharp, pressure.  Intensity:  severe.  Associated symptoms:  Photophobia, phonophobia, mild blurred vision.  Sometimes nausea, photophobia and dizziness.  Denies vomiting, or unilateral numbness or weakness.   Duration:  all day.  Frequency:  Varies.  May have 3 weeks of daily headaches every  other month, other times may have 1 or 2 in a week.  Preceding aura:  absent.  Prodrome:  absent.  Triggers:  unknown.  Relieving factors:  resting in dark room.  Activity:  aggravates   Formal eye exam was unremarkable.   Seen by cardiology in 2022 for dizziness and palpitations.  Echo and cardiac event monitor were unremarkable.   CT head on 02/13/2021 personally reviewed was normal.   Past NSAIDS/analgesics:  Fioricet, ibuprofen, naproxen, Tylenol, Goody Past abortive triptans:  none Past abortive ergotamine:  none Past muscle relaxants:  none Past anti-emetic:  Zofran Past antihypertensive medications:  propranolol Past antidepressant medications:  none Past anticonvulsant medications:  topiramate Past anti-CGRP:  none Past vitamins/Herbal/Supplements:  none Past antihistamines/decongestants:  none Other past therapies:  none    Family history of headache:  mom    PAST MEDICAL HISTORY: Past Medical History:  Diagnosis Date   Abdominal pain    ADHD    Allergic rhinitis    Anxiety    Delayed sleep phase syndrome    Dermatographism    Dizziness    Flat feet, bilateral    Inattention    Keratosis    Major depression    Migraine without aura and without status migrainosus, not intractable    Vitamin D deficiency    Vitiligo     MEDICATIONS: Current Outpatient Medications on File Prior to Visit  Medication Sig Dispense Refill   ARIPiprazole (ABILIFY) 2 MG tablet Take 3 mg by mouth daily.     cetirizine (ZYRTEC) 10 MG tablet Take 10 mg by mouth  daily.     fluticasone (FLONASE) 50 MCG/ACT nasal spray Place 1 spray into both nostrils daily.     ibuprofen (ADVIL,MOTRIN) 200 MG tablet Take 200 mg by mouth every 6 (six) hours as needed. (Patient not taking: Reported on 03/07/2022)     lamoTRIgine (LAMICTAL) 25 MG tablet Take 50 mg by mouth daily.     meclizine (ANTIVERT) 25 MG tablet Take 1  tablet (25 mg total) by mouth 3 (three) times daily as needed for dizziness. 30 tablet 0   omeprazole (PRILOSEC) 40 MG capsule TAKE 1 CAPSULE(40 MG) BY MOUTH DAILY (Patient not taking: Reported on 03/07/2022) 30 capsule 1   ondansetron (ZOFRAN) 4 MG tablet Take 1 tablet (4 mg total) by mouth every 8 (eight) hours as needed for nausea or vomiting. 20 tablet 5   propranolol ER (INDERAL LA) 60 MG 24 hr capsule Take 1 capsule (60 mg total) by mouth daily. 30 capsule 5   rizatriptan (MAXALT) 10 MG tablet Take 1 tablet (10 mg total) by mouth as needed for migraine. May repeat in 2 hours if needed.  Maximum 2 tablets in 24 hours. 10 tablet 5   sertraline (ZOLOFT) 100 MG tablet Take 100 mg by mouth daily.     Current Facility-Administered Medications on File Prior to Visit  Medication Dose Route Frequency Provider Last Rate Last Admin   0.9 %  sodium chloride infusion  500 mL Intravenous Once Jenel Lucks, MD        ALLERGIES: Allergies  Allergen Reactions   Albuterol     FAMILY HISTORY: Family History  Problem Relation Age of Onset   Celiac disease Neg Hx    Ulcers Neg Hx    Thyroid disease Mother    Cancer Maternal Grandmother        breast   Thyroid disease Maternal Grandmother    Diabetes Maternal Grandfather       Objective:  Blood pressure 135/65, pulse 97, resp. rate 18, height 6\' 2"  (1.88 m), weight 240 lb (108.9 kg), SpO2 98 %. General: No acute distress.  Patient appears well-groomed.   Head:  Normocephalic/atraumatic    Shon Millet, DO  CC: Roslynn Amble, PA

## 2022-11-20 ENCOUNTER — Ambulatory Visit: Payer: 59 | Admitting: Neurology

## 2022-11-20 ENCOUNTER — Encounter: Payer: Self-pay | Admitting: Neurology

## 2022-11-20 VITALS — BP 135/65 | HR 97 | Resp 18 | Ht 74.0 in | Wt 240.0 lb

## 2022-11-20 DIAGNOSIS — G43009 Migraine without aura, not intractable, without status migrainosus: Secondary | ICD-10-CM

## 2023-03-23 ENCOUNTER — Other Ambulatory Visit: Payer: Self-pay | Admitting: Neurology

## 2023-05-23 NOTE — Progress Notes (Deleted)
NEUROLOGY FOLLOW UP OFFICE NOTE  Garrett Booker [id number].  Assessment/Plan:   Migraine without aura, without status migrainosus, not intractable   Migraine prevention:  Aimovig 140mg  every 28 days *** Migraine rescue:  Rizatriptan 10mg , Zofran 4mg  if needed *** Limit use of pain relievers to no more than 2 days out of week to prevent risk of rebound or medication-overuse headache. Keep headache diary Follow up 4 months       Subjective:  Garrett Booker is a 22 year old right-handed male with ADHD who follows up for migraine.  UPDATE: ***  Intensity:  moderate Duration:  1 hour with rizatriptan Frequency:  7 a month  Current NSAIDS/analgesics: none Current triptans:  rizatriptan 10mg  Current ergotamine:  none Current anti-emetic:  Zofran 4mg  Current muscle relaxants:  none Current Antihypertensive medications:  none Current Antidepressant medications:  sertraline 100mg  daily Current Anticonvulsant medications:  lamotrigine 50mg  daily Current anti-CGRP:  none Current Vitamins/Herbal/Supplements:  none Current Antihistamines/Decongestants:  Zyrtec, Flonase Other therapy:  none Hormone/birth control:  none Other medications:  Abilify      Caffeine:  1 cup coffee in morning Exercise:  30-45 minutes cardio daily Depression:  stable; Anxiety:  stable Sleep hygiene:  varies  HISTORY:  Onset:  2022.  Location:  usually back/crown, occasionally forehead.  Quality:  sharp, pressure.  Intensity:  severe.  Associated symptoms:  Photophobia, phonophobia, mild blurred vision.  Sometimes nausea, photophobia and dizziness.  Denies vomiting, or unilateral numbness or weakness.  Duration:  all day.  Frequency:  Varies.  May have 3 weeks of daily headaches every  other month, other times may have 1 or 2 in a week.  Preceding aura:  absent.  Prodrome:  absent.  Triggers:  unknown.  Relieving factors:  resting in dark room.  Activity:  aggravates   Formal eye exam was  unremarkable.   Seen by cardiology in 2022 for dizziness and palpitations.  Echo and cardiac event monitor were unremarkable.   CT head on 02/13/2021 personally reviewed was normal.   Past NSAIDS/analgesics:  Fioricet, ibuprofen, naproxen, Tylenol, Goody Past abortive triptans:  none Past abortive ergotamine:  none Past muscle relaxants:  none Past anti-emetic:  Zofran Past antihypertensive medications:  propranolol Past antidepressant medications:  none Past anticonvulsant medications:  topiramate Past anti-CGRP:  none Past vitamins/Herbal/Supplements:  none Past antihistamines/decongestants:  none Other past therapies:  none    Family history of headache:  mom    PAST MEDICAL HISTORY: Past Medical History:  Diagnosis Date   Abdominal pain    ADHD    Allergic rhinitis    Anxiety    Delayed sleep phase syndrome    Dermatographism    Dizziness    Flat feet, bilateral    Inattention    Keratosis    Major depression    Migraine without aura and without status migrainosus, not intractable    Vitamin D deficiency    Vitiligo     MEDICATIONS: Current Outpatient Medications on File Prior to Visit  Medication Sig Dispense Refill   ARIPiprazole (ABILIFY) 2 MG tablet Take 3 mg by mouth daily.     cetirizine (ZYRTEC) 10 MG tablet Take 10 mg by mouth daily.     Erenumab-aooe (AIMOVIG) 140 MG/ML SOAJ ADMINISTER 1 ML UNDER THE SKIN EVERY 28 DAYS 1 mL 2   fluticasone (FLONASE) 50 MCG/ACT nasal spray Place 1 spray into both nostrils daily.     ibuprofen (ADVIL,MOTRIN) 200 MG tablet Take 200 mg by mouth  every 6 (six) hours as needed.     lamoTRIgine (LAMICTAL) 25 MG tablet Take 50 mg by mouth daily.     meclizine (ANTIVERT) 25 MG tablet Take 1 tablet (25 mg total) by mouth 3 (three) times daily as needed for dizziness. 30 tablet 0   omeprazole (PRILOSEC) 40 MG capsule TAKE 1 CAPSULE(40 MG) BY MOUTH DAILY 30 capsule 1   ondansetron (ZOFRAN) 4 MG tablet Take 1 tablet (4 mg total) by  mouth every 8 (eight) hours as needed for nausea or vomiting. 20 tablet 5   propranolol ER (INDERAL LA) 60 MG 24 hr capsule Take 1 capsule (60 mg total) by mouth daily. 30 capsule 5   rizatriptan (MAXALT) 10 MG tablet Take 1 tablet (10 mg total) by mouth as needed for migraine. May repeat in 2 hours if needed.  Maximum 2 tablets in 24 hours. 10 tablet 5   sertraline (ZOLOFT) 100 MG tablet Take 100 mg by mouth daily.     Current Facility-Administered Medications on File Prior to Visit  Medication Dose Route Frequency Provider Last Rate Last Admin   0.9 %  sodium chloride infusion  500 mL Intravenous Once Jenel Lucks, MD        ALLERGIES: Allergies  Allergen Reactions   Albuterol     FAMILY HISTORY: Family History  Problem Relation Age of Onset   Celiac disease Neg Hx    Ulcers Neg Hx    Thyroid disease Mother    Cancer Maternal Grandmother        breast   Thyroid disease Maternal Grandmother    Diabetes Maternal Grandfather       Objective:  *** General: No acute distress.  Patient appears well-groomed.   Head:  Normocephalic/atraumatic Head:  Normocephalic/atraumatic Neck:  Supple.  No paraspinal tenderness.  Full range of motion. Heart:  Regular rate and rhythm. Neuro:  Alert and oriented.  Speech fluent and not dysarthric.  Language intact.  CN II-XII intact.  Bulk and tone normal.  Muscle strength 5/5 throughout.  Deep tendon reflexes 2+ throughout.  Gait normal.  Romberg negative.     Shon Millet, DO  CC: Roslynn Amble, PA

## 2023-05-24 ENCOUNTER — Ambulatory Visit: Payer: 59 | Admitting: Neurology

## 2023-05-24 ENCOUNTER — Encounter: Payer: Self-pay | Admitting: Neurology

## 2023-05-24 DIAGNOSIS — Z029 Encounter for administrative examinations, unspecified: Secondary | ICD-10-CM

## 2023-08-17 ENCOUNTER — Ambulatory Visit: Payer: Self-pay | Admitting: Plastic Surgery

## 2023-08-17 VITALS — BP 104/69 | HR 71 | Ht 74.0 in | Wt 238.2 lb

## 2023-08-17 DIAGNOSIS — M950 Acquired deformity of nose: Secondary | ICD-10-CM | POA: Insufficient documentation

## 2023-08-17 NOTE — Progress Notes (Signed)
The patient is a 23 year old male here for evaluation of his nose.  He was seen by Dr. Ernestene Kiel but then has not heard anything back.  He would like to proceed with surgery as he is having a very difficult time breathing through the right side of his nose.  He is not wanting anything done to the outside of his nose or the appearance.  I have sent a note to Dr. Ernestene Kiel and instructed the patient that if he does not hear anything back in a week to let me know.

## 2023-10-29 ENCOUNTER — Other Ambulatory Visit: Payer: Self-pay | Admitting: Otolaryngology

## 2023-11-07 ENCOUNTER — Encounter (HOSPITAL_BASED_OUTPATIENT_CLINIC_OR_DEPARTMENT_OTHER): Payer: Self-pay | Admitting: Otolaryngology

## 2023-11-07 ENCOUNTER — Other Ambulatory Visit: Payer: Self-pay

## 2023-11-14 ENCOUNTER — Ambulatory Visit (HOSPITAL_BASED_OUTPATIENT_CLINIC_OR_DEPARTMENT_OTHER): Admitting: Anesthesiology

## 2023-11-14 ENCOUNTER — Encounter (HOSPITAL_BASED_OUTPATIENT_CLINIC_OR_DEPARTMENT_OTHER): Payer: Self-pay | Admitting: Otolaryngology

## 2023-11-14 ENCOUNTER — Other Ambulatory Visit: Payer: Self-pay

## 2023-11-14 ENCOUNTER — Encounter (HOSPITAL_BASED_OUTPATIENT_CLINIC_OR_DEPARTMENT_OTHER)
Admission: RE | Disposition: A | Discharge status: Home / Self Care | Payer: Self-pay | Source: Home / Self Care | Attending: Otolaryngology

## 2023-11-14 ENCOUNTER — Ambulatory Visit (HOSPITAL_BASED_OUTPATIENT_CLINIC_OR_DEPARTMENT_OTHER)
Admission: RE | Admit: 2023-11-14 | Discharge: 2023-11-14 | Disposition: A | Discharge status: Home / Self Care | Attending: Otolaryngology | Admitting: Otolaryngology

## 2023-11-14 DIAGNOSIS — J342 Deviated nasal septum: Secondary | ICD-10-CM | POA: Diagnosis present

## 2023-11-14 DIAGNOSIS — J348200 Internal nasal valve collapse, unspecified: Secondary | ICD-10-CM | POA: Diagnosis not present

## 2023-11-14 DIAGNOSIS — J34829 Nasal valve collapse, unspecified: Secondary | ICD-10-CM | POA: Diagnosis not present

## 2023-11-14 DIAGNOSIS — M950 Acquired deformity of nose: Secondary | ICD-10-CM

## 2023-11-14 DIAGNOSIS — J343 Hypertrophy of nasal turbinates: Secondary | ICD-10-CM

## 2023-11-14 DIAGNOSIS — J3489 Other specified disorders of nose and nasal sinuses: Secondary | ICD-10-CM | POA: Diagnosis not present

## 2023-11-14 DIAGNOSIS — Z01818 Encounter for other preprocedural examination: Secondary | ICD-10-CM

## 2023-11-14 HISTORY — DX: Other complications of anesthesia, initial encounter: T88.59XA

## 2023-11-14 HISTORY — PX: NASAL SEPTOPLASTY W/ TURBINOPLASTY: SHX2070

## 2023-11-14 HISTORY — PX: NASAL RECONSTRUCTION WITH SEPTAL REPAIR: SHX5665

## 2023-11-14 SURGERY — RECONSTRUCTION, NOSE, WITH NASAL SEPTUM REPAIR
Anesthesia: General | Site: Nose

## 2023-11-14 MED ORDER — OXYCODONE HCL 5 MG PO TABS
ORAL_TABLET | ORAL | Status: AC
  Filled 2023-11-14: qty 1

## 2023-11-14 MED ORDER — SALINE SPRAY 0.65 % NA SOLN: 2.0000 | Freq: Four times a day (QID) | NASAL | 1 refills | Status: AC

## 2023-11-14 MED ORDER — MUPIROCIN 2 % EX OINT
TOPICAL_OINTMENT | CUTANEOUS | Status: AC
  Filled 2023-11-14: qty 22

## 2023-11-14 MED ORDER — FENTANYL CITRATE (PF) 100 MCG/2ML IJ SOLN: 25.0000 ug | INTRAMUSCULAR | Status: DC | PRN

## 2023-11-14 MED ORDER — SODIUM CHLORIDE 0.9 % IR SOLN
Status: DC | PRN
  Administered 2023-11-14: 1

## 2023-11-14 MED ORDER — HYDROMORPHONE HCL 1 MG/ML IJ SOLN: 0.2500 mg | INTRAMUSCULAR | Status: DC | PRN

## 2023-11-14 MED ORDER — LACTATED RINGERS IV SOLN: INTRAVENOUS | Status: DC

## 2023-11-14 MED ORDER — OXYMETAZOLINE HCL 0.05 % NA SOLN
NASAL | Status: AC
  Filled 2023-11-14: qty 30

## 2023-11-14 MED ORDER — ACETAMINOPHEN 500 MG PO TABS
1000.0000 mg | ORAL_TABLET | Freq: Once | ORAL | Status: AC
  Administered 2023-11-14: 1000 mg via ORAL

## 2023-11-14 MED ORDER — ONDANSETRON HCL 4 MG/2ML IJ SOLN
INTRAMUSCULAR | Status: DC | PRN
  Administered 2023-11-14: 4 mg via INTRAVENOUS

## 2023-11-14 MED ORDER — CEFAZOLIN SODIUM-DEXTROSE 2-3 GM-%(50ML) IV SOLR
INTRAVENOUS | Status: DC | PRN
  Administered 2023-11-14: 2 g via INTRAVENOUS

## 2023-11-14 MED ORDER — LIDOCAINE-EPINEPHRINE 1 %-1:100000 IJ SOLN
INTRAMUSCULAR | Status: DC | PRN
  Administered 2023-11-14: 4 mL
  Administered 2023-11-14: 10 mL

## 2023-11-14 MED ORDER — OXYCODONE HCL 5 MG/5ML PO SOLN: 5.0000 mg | Freq: Once | ORAL | Status: AC | PRN

## 2023-11-14 MED ORDER — BACITRACIN ZINC 500 UNIT/GM EX OINT
TOPICAL_OINTMENT | CUTANEOUS | Status: AC
Start: 2023-11-14 — End: ?
  Filled 2023-11-14: qty 28.35

## 2023-11-14 MED ORDER — FENTANYL CITRATE (PF) 100 MCG/2ML IJ SOLN
INTRAMUSCULAR | Status: AC
  Filled 2023-11-14: qty 2

## 2023-11-14 MED ORDER — FLUORESCEIN SODIUM 1 MG OP STRP
ORAL_STRIP | OPHTHALMIC | Status: DC | PRN
  Administered 2023-11-14: 2

## 2023-11-14 MED ORDER — MIDAZOLAM HCL 5 MG/5ML IJ SOLN
INTRAMUSCULAR | Status: DC | PRN
  Administered 2023-11-14: 2 mg via INTRAVENOUS

## 2023-11-14 MED ORDER — SUGAMMADEX SODIUM 200 MG/2ML IV SOLN
INTRAVENOUS | Status: DC | PRN
  Administered 2023-11-14: 200 mg via INTRAVENOUS

## 2023-11-14 MED ORDER — ONDANSETRON HCL 4 MG/2ML IJ SOLN
INTRAMUSCULAR | Status: AC
  Filled 2023-11-14: qty 2

## 2023-11-14 MED ORDER — DEXAMETHASONE SODIUM PHOSPHATE 10 MG/ML IJ SOLN
INTRAMUSCULAR | Status: DC | PRN
  Administered 2023-11-14: 10 mg via INTRAVENOUS

## 2023-11-14 MED ORDER — EPHEDRINE SULFATE-NACL 50-0.9 MG/10ML-% IV SOSY
PREFILLED_SYRINGE | INTRAVENOUS | Status: DC | PRN
  Administered 2023-11-14: 10 mg via INTRAVENOUS

## 2023-11-14 MED ORDER — HYDROCODONE-ACETAMINOPHEN 5-325 MG PO TABS: 1.0000 | ORAL_TABLET | Freq: Four times a day (QID) | ORAL | 0 refills | Status: AC | PRN

## 2023-11-14 MED ORDER — OXYCODONE HCL 5 MG PO TABS
5.0000 mg | ORAL_TABLET | Freq: Once | ORAL | Status: AC | PRN
  Administered 2023-11-14: 5 mg via ORAL

## 2023-11-14 MED ORDER — OXYMETAZOLINE HCL 0.05 % NA SOLN
NASAL | Status: AC
  Filled 2023-11-14: qty 60

## 2023-11-14 MED ORDER — FENTANYL CITRATE (PF) 100 MCG/2ML IJ SOLN
INTRAMUSCULAR | Status: DC | PRN
  Administered 2023-11-14: 100 ug via INTRAVENOUS
  Administered 2023-11-14: 50 ug via INTRAVENOUS

## 2023-11-14 MED ORDER — DEXMEDETOMIDINE HCL IN NACL 80 MCG/20ML IV SOLN
INTRAVENOUS | Status: DC | PRN
  Administered 2023-11-14: 8 ug via INTRAVENOUS
  Administered 2023-11-14: 4 ug via INTRAVENOUS
  Administered 2023-11-14: 8 ug via INTRAVENOUS

## 2023-11-14 MED ORDER — FLUORESCEIN SODIUM 1 MG OP STRP
ORAL_STRIP | OPHTHALMIC | Status: AC
  Filled 2023-11-14: qty 2

## 2023-11-14 MED ORDER — LACTATED RINGERS IV SOLN: INTRAVENOUS | Status: DC | PRN

## 2023-11-14 MED ORDER — DEXAMETHASONE SODIUM PHOSPHATE 10 MG/ML IJ SOLN
INTRAMUSCULAR | Status: AC
  Filled 2023-11-14: qty 1

## 2023-11-14 MED ORDER — LIDOCAINE-EPINEPHRINE 1 %-1:100000 IJ SOLN
INTRAMUSCULAR | Status: AC
  Filled 2023-11-14: qty 1

## 2023-11-14 MED ORDER — AMISULPRIDE (ANTIEMETIC) 5 MG/2ML IV SOLN
10.0000 mg | Freq: Once | INTRAVENOUS | Status: AC | PRN
  Administered 2023-11-14: 10 mg via INTRAVENOUS

## 2023-11-14 MED ORDER — MUPIROCIN 2 % EX OINT
TOPICAL_OINTMENT | CUTANEOUS | Status: DC | PRN
  Administered 2023-11-14: 1 via TOPICAL

## 2023-11-14 MED ORDER — ROCURONIUM 10MG/ML (10ML) SYRINGE FOR MEDFUSION PUMP - OPTIME
INTRAVENOUS | Status: DC | PRN
  Administered 2023-11-14: 70 mg via INTRAVENOUS

## 2023-11-14 MED ORDER — AMISULPRIDE (ANTIEMETIC) 5 MG/2ML IV SOLN
INTRAVENOUS | Status: AC
  Filled 2023-11-14: qty 4

## 2023-11-14 MED ORDER — PROPOFOL 10 MG/ML IV BOLUS
INTRAVENOUS | Status: DC | PRN
  Administered 2023-11-14: 100 ug via INTRAVENOUS
  Administered 2023-11-14: 200 ug via INTRAVENOUS

## 2023-11-14 MED ORDER — LIDOCAINE 2% (20 MG/ML) 5 ML SYRINGE
INTRAMUSCULAR | Status: DC | PRN
  Administered 2023-11-14: 80 mg via INTRAVENOUS

## 2023-11-14 MED ORDER — MUPIROCIN 2 % EX OINT: 1.0000 | TOPICAL_OINTMENT | Freq: Two times a day (BID) | CUTANEOUS | 0 refills | Status: AC

## 2023-11-14 MED ORDER — EPINEPHRINE HCL (NASAL) 0.1 % NA SOLN
NASAL | Status: AC
  Filled 2023-11-14: qty 20

## 2023-11-14 MED ORDER — MIDAZOLAM HCL 2 MG/2ML IJ SOLN
INTRAMUSCULAR | Status: AC
Start: 2023-11-14 — End: ?
  Filled 2023-11-14: qty 2

## 2023-11-14 MED ORDER — LIDOCAINE 2% (20 MG/ML) 5 ML SYRINGE
INTRAMUSCULAR | Status: AC
  Filled 2023-11-14: qty 5

## 2023-11-14 MED ORDER — ROCURONIUM BROMIDE 10 MG/ML (PF) SYRINGE
PREFILLED_SYRINGE | INTRAVENOUS | Status: AC
  Filled 2023-11-14: qty 10

## 2023-11-14 MED ORDER — EPINEPHRINE HCL (NASAL) 0.1 % NA SOLN
NASAL | Status: DC | PRN
  Administered 2023-11-14 (×2): 10 mL via TOPICAL

## 2023-11-14 MED ORDER — METHYLPREDNISOLONE 4 MG PO TBPK: ORAL_TABLET | ORAL | 0 refills | Status: DC

## 2023-11-14 MED ORDER — MEPERIDINE HCL 25 MG/ML IJ SOLN: 6.2500 mg | INTRAMUSCULAR | Status: DC | PRN

## 2023-11-14 MED ORDER — ACETAMINOPHEN 500 MG PO TABS
ORAL_TABLET | ORAL | Status: AC
  Filled 2023-11-14: qty 2

## 2023-11-14 SURGICAL SUPPLY — 69 items
APPLICATOR DR MATTHEWS STRL (MISCELLANEOUS) ×2 IMPLANT
BLADE INF TURB ROT M4 2 5PK (BLADE) IMPLANT
BLADE SHAVER TURBINATE 11X2.9 (BLADE) ×2 IMPLANT
BLADE SURG 15 STRL LF DISP TIS (BLADE) ×2 IMPLANT
CANISTER SUCT 1200ML W/VALVE (MISCELLANEOUS) ×2 IMPLANT
COAGULATOR SUCT 8FR VV (MISCELLANEOUS) IMPLANT
COAGULATOR SUCT SWTCH 10FR 6 (ELECTROSURGICAL) IMPLANT
DRSG NASOPORE 8CM (GAUZE/BANDAGES/DRESSINGS) IMPLANT
DRSG TEGADERM 2-3/8X2-3/4 SM (GAUZE/BANDAGES/DRESSINGS) ×2 IMPLANT
DRSG TELFA 3X8 NADH STRL (GAUZE/BANDAGES/DRESSINGS) IMPLANT
ELECT NDL BLADE 2-5/6 (NEEDLE) ×2 IMPLANT
ELECT NEEDLE BLADE 2-5/6 (NEEDLE) ×2 IMPLANT
ELECTRODE REM PT RTRN 9FT ADLT (ELECTROSURGICAL) ×2 IMPLANT
GAUZE SPONGE 2X2 STRL 8-PLY (GAUZE/BANDAGES/DRESSINGS) ×2 IMPLANT
GLOVE BIO SURGEON STRL SZ7.5 (GLOVE) ×2 IMPLANT
GLOVE BIOGEL PI IND STRL 7.0 (GLOVE) IMPLANT
GLOVE BIOGEL PI IND STRL 8 (GLOVE) ×2 IMPLANT
GLOVE SURG SYN 7.5 E (GLOVE) ×2 IMPLANT
GLOVE SURG SYN 7.5 PF PI (GLOVE) IMPLANT
GOWN STRL REUS W/ TWL LRG LVL3 (GOWN DISPOSABLE) ×2 IMPLANT
GOWN STRL REUS W/ TWL XL LVL3 (GOWN DISPOSABLE) ×2 IMPLANT
IV NS 1000ML BAXH (IV SOLUTION) IMPLANT
IV SET EXT 30 76VOL 4 MALE LL (IV SETS) IMPLANT
MANIFOLD NEPTUNE II (INSTRUMENTS) ×2 IMPLANT
MARKER SKIN DUAL TIP RULER LAB (MISCELLANEOUS) IMPLANT
NDL FILTER BLUNT 18X1 1/2 (NEEDLE) ×2 IMPLANT
NDL HYPO 25X1 1.5 SAFETY (NEEDLE) IMPLANT
NDL PRECISIONGLIDE 27X1.5 (NEEDLE) ×4 IMPLANT
NEEDLE FILTER BLUNT 18X1 1/2 (NEEDLE) IMPLANT
NEEDLE HYPO 25X1 1.5 SAFETY (NEEDLE) IMPLANT
NEEDLE PRECISIONGLIDE 27X1.5 (NEEDLE) ×4 IMPLANT
NS IRRIG 1000ML POUR BTL (IV SOLUTION) ×2 IMPLANT
PACK BASIN DAY SURGERY FS (CUSTOM PROCEDURE TRAY) ×2 IMPLANT
PACK ENT DAY SURGERY (CUSTOM PROCEDURE TRAY) ×2 IMPLANT
PATTIES SURGICAL .5 X3 (DISPOSABLE) ×2 IMPLANT
PENCIL SMOKE EVACUATOR (MISCELLANEOUS) ×2 IMPLANT
SHEATH ENDOSCRUB 0 DEG (SHEATH) ×2 IMPLANT
SLEEVE SCD COMPRESS KNEE MED (STOCKING) ×2 IMPLANT
SOLUTION ANTFG W/FOAM PAD STRL (MISCELLANEOUS) ×2 IMPLANT
SPIKE FLUID TRANSFER (MISCELLANEOUS) IMPLANT
SPLINT NASAL AIRWAY SILICONE (MISCELLANEOUS) ×2 IMPLANT
SPLINT NASAL DENVER LRG BLUSH (MISCELLANEOUS) IMPLANT
SPLINT NASAL DENVER SM/MD BLUS (MISCELLANEOUS) IMPLANT
SPLINT NASAL POSISEP X .6X2 (GAUZE/BANDAGES/DRESSINGS) IMPLANT
SPONGE NEURO XRAY DETECT 1X3 (DISPOSABLE) IMPLANT
SPONGE SURGIFOAM ABS GEL 12-7 (HEMOSTASIS) IMPLANT
STRIP CLOSURE SKIN 1/2X4 (GAUZE/BANDAGES/DRESSINGS) IMPLANT
SUT CHROMIC 3 0 PS 2 (SUTURE) ×2 IMPLANT
SUT CHROMIC 5 0 P 3 (SUTURE) ×2 IMPLANT
SUT CHROMIC 5 0 RB 1 27 (SUTURE) IMPLANT
SUT ETHILON 6 0 P 1 (SUTURE) IMPLANT
SUT MNCRL 6-0 UNDY P1 1X18 (SUTURE) IMPLANT
SUT PDS AB 4-0 RB1 27 (SUTURE) IMPLANT
SUT PDS II 5-0 VIOLET 1X18 TF (SUTURE) IMPLANT
SUT PLAIN 4 0 ~~LOC~~ 1 (SUTURE) IMPLANT
SUT PLAIN 6 0 TG1408 (SUTURE) IMPLANT
SUT PLAIN GUT FAST 5-0 (SUTURE) ×2 IMPLANT
SUT PROLENE 3 0 PS 2 (SUTURE) IMPLANT
SUT SILK 3 0 PS 1 (SUTURE) ×2 IMPLANT
SUT VICRYL RAPIDE 4-0 (SUTURE) IMPLANT
SYR 10ML LL (SYRINGE) ×2 IMPLANT
SYR BULB EAR ULCER 3OZ GRN STR (SYRINGE) IMPLANT
SYR CONTROL 10ML LL (SYRINGE) IMPLANT
TAPE PAPER 1/2X10 TAN MEDIPORE (MISCELLANEOUS) ×2 IMPLANT
TOWEL GREEN STERILE FF (TOWEL DISPOSABLE) ×2 IMPLANT
TUBE CONNECTING 20X1/4 (TUBING) ×2 IMPLANT
TUBE SALEM SUMP 12FR 48 (TUBING) IMPLANT
TUBE SALEM SUMP 16F (TUBING) ×2 IMPLANT
YANKAUER SUCT BULB TIP NO VENT (SUCTIONS) ×2 IMPLANT

## 2023-11-14 NOTE — Transfer of Care (Signed)
 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) SEPTOPLASTY WITH BILATERAL INFERIOR TURBINATE REDUCTION (Bilateral: Nose)  Patient Location: PACU  Anesthesia Type:General  Level of Consciousness: awake, alert , and oriented  Airway & Oxygen  Therapy: Patient Spontanous Breathing and Patient connected to face mask oxygen   Post-op Assessment: Report given to RN and Post -op Vital signs reviewed and stable  Post vital signs: Reviewed and stable  Last Vitals:  Vitals Value Taken Time  BP 139/75 11/14/23 1200  Temp 36.6 C 11/14/23 1153  Pulse 94 11/14/23 1200  Resp 23 11/14/23 1200  SpO2 97 % 11/14/23 1200  Vitals shown include unfiled device data.  Last Pain:  Vitals:   11/14/23 1153  TempSrc:   PainSc: Asleep      Patients Stated Pain Goal: 7 (11/14/23 0725)  Complications: No notable events documented.

## 2023-11-14 NOTE — Op Note (Signed)
 FACIAL PLASTIC SURGERY OPERATIVE NOTE  Garrett Booker Date/Time of Admission: 11/14/2023  7:06 AM  CSN: [id number]:[id number] Attending Provider: Rush Coupe, MD Room/Bed: [facility]/NONE DOB: [birth year] Age: 23 y.o.   Pre-Op Diagnosis: Nasal vestibulitis; Nasal valve collapse; Deviated nasal septum; Hypertrophy of both inferior nasal turbinates  Post-Op Diagnosis: Nasal vestibulitis; Nasal valve collapse; Deviated nasal septum; Hypertrophy of both inferior nasal turbinates  Procedure: Rhinoplasty - repair nasal vestibular stenosis 30465 Septoplasty 30520 Bilateral inferior turbinate reduction and outfracture 30140 modifier 50  Anesthesia: General  Surgeon(s): Marijane Shoulders, MD  Staff: Circulator: Jolaine Nasuti, RN Relief Scrub: Tasia Farr K Scrub Person: Gaynell Keeler Circulator Assistant: Jolynn Needy, RN  Implants: * No implants in log *  Specimens: * No specimens in log *  Complications: none  EBL: 50 ML  IVF: Per anesthesia ML  Condition: stable  Operative Findings:  Severe Right nasal septal deviation, 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 3+ inferior turbinate hypertrophy  Open septorhinoplasty techniques Septal cartilage harvest with preservation of 1cm L-Strut Bilateral 30x21mm lateral crural strut grafts secured in underlay fashion spanning the piriform aperture of the maxilla   Description of Operation:   The patient was identified in the pre-operative area and consent confirmed in the chart. The patient was brought to the operating room by the anesthetist and a pre-operative huddle was performed confirming the patient's identity and procedure to be performed. Once all were in agreement we proceeded with surgery. General anesthesia was induced and the patient was intubated with an oral endotracheal tube. The patient was turned 180 degrees from the anesthetist. The  patients external nose was examined with the findings as noted above. 1:1000 epinephrine pledgets were placed into bilateral nasal cavities and the vibrissae were trimmed with bacitracin coated curved iris scissors. Next the nasal septum was anesthetized with  1% lidocaine 1:100 K. Next the columella, marginal incisions, nasal spine, nasal tip and dorsum were further anesthetized 1% lidocaine 1:100 K epi.   The patient was prepped and draped in standard fashion for septorhinoplasty. A 0 degree rigid endoscope was attached to the video monitoring system and used for turbinate reduction and nasal endoscopy.    We began with nasal endoscopy. The nasal pledgets were removed and the endoscope was advanced into the left and right nasal cavities demonstrating the findings noted above.   After insertion of a nasal speculum, the septum was injected with 1% lidocaine with epinephrine. A hemitransfixion incision was performed along the left side and a subperichondrial flap was elevated. The flap was then elevated on the opposite side and the deviated cartilage was removed leaving a 1 centimeter dorsal and anterior/inferior strut. Deviated bony septum was also excised with double action scissors and open Jansen-Middleton forceps. A maxillary spur was removed with the 4mm osteotome.   A 3-0 chromic running quilting suture was used to oppose both perichondrial flaps and the hemitransfixion incision was closed with interrupted  5-0 Chromic sutures.   Next we proceeded with an open septorhinoplasty for repair nasal vestibular stenosis with lateral crural strut grafts utilizing an inverted V trans-columellar incision. A 15 blade was used to make the lateral columellar incisions and a littler scissor used to dissect between the medial crura and the skin/soft tissue envelope. Once a full pocket was dissected a 15 blade was use complete the inverted V incisions. Bleeding from the columellar vessels was controlled with  monopolar cautery. Double pronged skin hooks were used to provide  optimal tension/counter-tension and the marginal incisions were created by dissecting carefully along the medial crura, intermediate crura and lateral crura on the left side in a sub-mucoperiochondrial plane. Once the left lower lateral cartilage [facility]) was exposed we proceeded with skeletonizing the right LLC in similar fashion taking care to avoid violating the structural integrity. The tip soft tissues were elevated off of the domal region. The ULC's were dissected in a submucoperichondrial plane up to the rhinion. A joseph elevator was used to elevate in a subperiosteal plane and a Aufricht retractor was placed, which was later transitioned to a converse retractor.  Next local anesthetic was infiltrated into the mucosa underlying the lateral crura of the lower lateral cartilages.  Adequate hydrodissection performed.  Next of the scroll region was incised with a 15 blade starting on the left.  The lateral crura was then grasped with a brown forcep and fine curved iris scissors were used to dissect in a submucoperichondrial plane dissecting out the lateral crura into the piriform aperture.  A precise pocket was designed for a lateral crural strut graft.  Next the identical dissection was performed on the right lateral crura of the lower lateral cartilage.  A precise pocket was dissected out here as well.  Next the septal cartilage harvest harvested was then used to design 3 cm x 4 mm lateral crural strut grafts in underlay technique.  These were contoured appropriately for the patient's anatomy.  Next a precise pocket was then opened with the curved iris scissors and the lateral crural strut graft was inserted on the left side and secured with 5-0 PDS mattress sutures.  Next the right lateral crural strut graft was placed in the precise pocket and secured with 5-0 PDS mattress sutures.  This allowed for significant strengthening of the internal  nasal valve.  The incision at the scrotal region was reapproximated bilaterally with interrupted 5-0 PDS sutures.   The inverted V incision was closed with buried interrupted 6-0 monocryl and interrupted 6-0 nylon / 5-0 fast gut suture. The marginal incisions were closed with interrupted 5-0 chromic gut suture.    We proceeded with the bilateral inferior turbinate reduction. Starting on the left side, a 15 blade scalpel was used to make an incision in the axilla of the head of the inferior turbinate. A cottle was used to raise a medial submucoperiosteal plane. The 2.46mm turbinate microdebrider was used to perform submucous resection. The turbinate was outfractured with the boise elevator and hemostasis achieved with bovie and epi pledgets. The right side was treated identically.   Doyle splints were applied bilaterally and secured with 3-0 silk trans-septal suture. The dorsum was dressed with brown paper tape and thermaplast splint.   The patient was turned back to the anesthetist who extubated and brought the patient to the recovery room in stable condition. All counts were correct and final.    Marijane Shoulders, MD [facility]  11/14/2023

## 2023-11-14 NOTE — Discharge Instructions (Addendum)
 Marijane Shoulders MD  FACIAL PLASTIC AND RECONSTRUCTIVE SURGERY    AFTER RHINOPLASTY SURGERY   After rhinoplasty surgery your nose will be packed with soft nasal packing. The packing will be removed the morning after surgery.     The nasal packing will prevent breathing through your nose so you will have to breathe through your mouth. Your mouth will become very dry. Please drink as much fluid as you can which will help you from becoming dehydrated. Drinks at the bedside along with a humidifier (cool or warm) may help. You will have a gauze drip pad placed beneath your nose. Change this as needed for the first 24 hours following rhinoplasty surgery. It is not uncommon to change this every 15 minutes for the first several hours following rhinoplasty. If you completely saturate the pad with bright red blood every five minutes, please call your surgeon at the numbers provided.  The cast must remain on your nose for one week after rhinoplasty surgery. It must be kept dry or it could become loose. Notify Dr. Tollefson immediately if the cast falls off.  Activity Sleep with head of the bed elevated or use two to three pillows. Sneeze with your mouth open and do not blow your nose of sniff for seven days after your rhinoplasty procedure. Absolutely no bending, lifting or straining. If you have little children, bend at the knees or sit on the floor and let them climb on to your lap.  Ensure that care is taken to keep the cast dry while bathing is important.   Diet Advance diet from liquids to soft food to your regular diet as tolerated. In the immediate rhinoplasty postoperative period, avoid extremely hot liquids or foods if you experience temporary numbness on the roof of the mouth.    Ice During the day and evening of surgery, cold moist compresses are used continuously over the eyes to minimize swelling and control bruising. Puffiness and bruising can occur but if present usually regresses quickly over the  next few days. There are several techniques for icing which are effective. The glove with ice and cool compresses are the preferred methods.  Icing for 24-48 hours is recommended, icing after this period can be used for comfort.  See video on right.   Breathing:  Invariably, there is some nasal stuffiness during the week after surgery. The external edema (swelling) is reflected internally, but the mild blockage will improve steadily. it is imperative to avoid extensive manipulation inside of the nose. DO NOT BLOW THE NOSE. DO NOT USE NOSE DECONGESTANT DROPS (AFFRIN) unless advised by your doctor.   Pain:  Discomfort following rhinoplasty is usually limited to the two or three hours just after the procedure. It may best be described as a headache. The prescription for pain tablets that you have received is more precautionary that necessary, but please have it filled and available at your home bedside. Take pain medicine with milk to avoid any stomach upset. Most patients switch to extra strength Tylenol  on the first day of recovery.  Adult Post-Operative Pain Management  Pain medication is given immediately following your surgery to help with post-operative pain. Upon your discharge home, we suggest scheduled doses of Acetaminophen  (tylenol ) every 6 hours and Ibuprofen  (Motrin ) every 6 hours, alternating between medications every 3 hours (i.e. Take tylenol  and wait 3 hours, then take Motrin  and wait 3 hours, repeat) for the first 3-4 days after surgery. If you are without significant pain, medications can be taken  more infrequently. It is important to follow dosing instructions on the medication bottle or prescription.   Sample of medication dosing schedule  Give dose of: Time: Given:  Acetaminophen  12 a.m.   Ibuprofen  3 a.m.   Acetaminophen  6 a.m.   Ibuprofen  9 a.m.   Acetaminophen  12 p.m.   Ibuprofen  3 p.m.   Acetaminophen  6 p.m.   Ibuprofen  9 p.m.       Medications Most patients complain  of pressure from swelling and congestion more than pain. Use pain medication (most commonly Vicodin/hydrocodone) as directed/as needed. Vicodin contains Tylenol . Do not take additional Tylenol  or acetaminophen  while taking Vicodin.    Do not drive or drink alcohol while taking pain medication.    Side effects of pain medications can include nausea and constipation. Taking pain medication with food can minimize nausea. Over-the-counter laxatives are indicated if constipation persists.   Start the prescription for swelling medication if prescribed (most commonly Medrol Dosepak/methyl prednisolone) when you arrive home following rhinoplasty surgery.   The evening following surgery start using the ointment (most commonly polysporin/bacitracin) two times a day (morning and evening) inside the base of each nostril. Insert only the cotton part of the Q-tip into your nose. Ointment is applied after the morning and evening saline rinses.   Please Remember! Nasal congestion, facial fullness, headache and disrupted sleep are very normal postoperative symptoms for rhinoplasty and will decrease as the healing process occurs. It is not uncommon to have numbness on the roof of the mouth (palate) behind the front teeth. Therefore avoid extremely hot liquids or food in the immediate rhinoplasty postoperative period.  After the First Week: Nasal Appearance:  Prior to your first post-operative visit please allow your nose to get wet in the shower; this will facilitate a more comfortable dressing removal. At the time of nasal splint removal, you will have your chance to see the new nose. It will appear quite swollen but, in most cases, even in this swollen condition, the improvement can be appreciated. It takes time for the skin and soft tissue to adhere to the new framework.  During your postoperative visits Dr. Ralston Burkes may use medicines to help contour the shape of your nose.    Final results following rhinoplasty are  not apparent for one full year following surgery.  After three months, the changes are ever so subtle, although still important. Being perfectionists about our work, you may tell us  you are pleased long before the one year anniversary. However, we request that you follow-up with us  at that time for postoperative rhinoplasty photographs and so that we can enjoy your final result.    Activity Sleep with head of the bed elevated or use two to three pillows for three weeks after surgery.  Three weeks after surgery you may resume full activity without medical restrictions.  It is advised that you resume your work-out regimen slowly, as your body may fatigue a little easier than usual.  Wearing Glasses:  You should not wear glasses for at least one month. If glasses must be worn, taping the central bridge of the glasses to the forehead will allow as little pressure as possible on the nasal bones.    Sun Exposure:  Your skin should be protected from sun exposure for at least six months after surgery. A sunburn will cause the nose to swell dramatically and delay the final result. Paulene Boron avoidance or protection with a hat is preferred.  You may begin wearing sun block three weeks after  surgery. Factor 30SPF sun block with both UVA and UVB protection.     General Postoperative Medication Checklist 1.            Pain reliever    2.            Medrol Dose Pack (possible)   3.            Nasal saline spray: purchased over the counter   4.            Antibiotic ointment: purchased over the counter   AFTER SURGERY CHECKLIST Immediately after the procedure  You must meet nursing criteria for discharge home; walking, voiding, talking. Your nose may have small packings inside the nostrils.  These control any bleeding after surgery but may partially obstruct your nasal breathing.     The First 24 Hours 1.            Wound care: cleaning the sutures with a Q-tip dipped in saline water and then apply the  antibiotic ointment (polysporin/bacitracin) twice a day.   2.            Activity: sleep with head of the bed elevated or use two to three pillows.  No strenuous activity / aerobics / yoga / heavy lifting for 3 weeks after surgery.   3.            Bathing is ok as long as you don't get your incisions wet for a minimum of four days after surgery.   4.            Advance diet from liquids to soft food to your regular diet as tolerated.   5.            Cold compresses are used continuously over the forehead to minimize swelling and control bruising (30 minutes on, 15 minutes off) for the first 48 hours after surgery.   6.            Medications: use pain medication as necessary.  You can have a maximum of 8 tablets of vicodin + Tylenol  in a 24 hour period (6 vicodin + 2 Tylenol  = 8, ect).     7.            Medications: Start Medrol Dosepak/methyl prednisolone when you arrive home and feeling ok to take medicine.   8.            Medications: Start the nasal saline spray 1 day after surgery.     One Week After Surgery 1.            Make-up can be started 1 week after surgery to camouflage any bruising or redness.   2.            Avoid sun exposure for three weeks after surgery, then use sun block.   3.            The tip of the nose and sometimes the front teeth will be numb to touch following surgery.  This will improve in the first few weeks to months after surgery.   4.            Swelling, bruising and disrupted sleep are very normal postoperative symptoms and will decrease as the healing process occurs.   5.            Differential swelling may asymmetries of the right and left sides of your nose.  As the swelling goes away, so will these asymmetries.  Please be patient.   If you need to call after clinic hours for a concern, call [phone]  [facility] [place] [address] [place], [place],  [address] [phone]    Post Anesthesia Home Care Instructions  Activity: Get plenty of rest for the remainder of the day. A responsible individual must stay with you for 24 hours following the procedure.  For the next 24 hours, DO NOT: -Drive a car -Advertising copywriter -Drink alcoholic beverages -Take any medication unless instructed by your physician -Make any legal decisions or sign important papers.  Meals: Start with liquid foods such as gelatin or soup. Progress to regular foods as tolerated. Avoid greasy, spicy, heavy foods. If nausea and/or vomiting occur, drink only clear liquids until the nausea and/or vomiting subsides. Call your physician if vomiting continues.  Special Instructions/Symptoms: Your throat may feel dry or sore from the anesthesia or the breathing tube placed in your throat during surgery. If this causes discomfort, gargle with warm salt water. The discomfort should disappear within 24 hours.  If you had a scopolamine patch placed behind your ear for the management of post- operative nausea and/or vomiting:  1. The medication in the patch is effective for 72 hours, after which it should be removed.  Wrap patch in a tissue and discard in the trash. Wash hands thoroughly with soap and water. 2. You may remove the patch earlier than 72 hours if you experience unpleasant side effects which may include dry mouth, dizziness or visual disturbances. 3. Avoid touching the patch. Wash your hands with soap and water after contact with the patch.    *May have Tylenol  at 1:30pm*

## 2023-11-14 NOTE — Anesthesia Postprocedure Evaluation (Signed)
 Anesthesia Post Note  Patient: Garrett Booker  Procedure(s) Performed: RHINOPLASTY WITH REPAIR OF VESTIBULAR STENOSIS (Nose) SEPTOPLASTY WITH BILATERAL INFERIOR TURBINATE REDUCTION (Bilateral: Nose)     Patient location during evaluation: PACU Anesthesia Type: General Level of consciousness: awake and alert and oriented Pain management: pain level controlled Vital Signs Assessment: post-procedure vital signs reviewed and stable Respiratory status: spontaneous breathing, nonlabored ventilation and respiratory function stable Cardiovascular status: blood pressure returned to baseline and stable Postop Assessment: no apparent nausea or vomiting Anesthetic complications: no   No notable events documented.  Last Vitals:  Vitals:   11/14/23 1245 11/14/23 1300  BP: (!) 142/81 (!) 144/79  Pulse: 77 80  Resp: (!) 21 20  Temp:    SpO2: 97% 96%    Last Pain:  Vitals:   11/14/23 1300  TempSrc:   PainSc: 0-No pain                 Lamya Lausch A.

## 2023-11-14 NOTE — Anesthesia Procedure Notes (Signed)
 Procedure Name: Intubation Date/Time: 11/14/2023 8:43 AM  Performed by: Darcel Early, CRNAPre-anesthesia Checklist: Patient identified, Emergency Drugs available, Suction available and Patient being monitored Patient Re-evaluated:Patient Re-evaluated prior to induction Oxygen  Delivery Method: Circle system utilized Preoxygenation: Pre-oxygenation with 100% oxygen  Induction Type: Combination inhalational/ intravenous induction Ventilation: Mask ventilation without difficulty Laryngoscope Size: Mac and 3 Grade View: Grade I Tube type: Oral Laser Tube: Cuffed inflated with minimal occlusive pressure - saline Tube size: 7.0 mm Number of attempts: 1 Airway Equipment and Method: Stylet Placement Confirmation: ETT inserted through vocal cords under direct vision, positive ETCO2 and breath sounds checked- equal and bilateral Secured at: 21 cm Tube secured with: Tape Dental Injury: Teeth and Oropharynx as per pre-operative assessment

## 2023-11-14 NOTE — Anesthesia Preprocedure Evaluation (Addendum)
 Anesthesia Evaluation  Patient identified by MRN, date of birth, ID band Patient awake    Reviewed: Allergy & Precautions, NPO status , Patient's Chart, lab work & pertinent test results  Airway Mallampati: II  TM Distance: >3 FB Neck ROM: Full    Dental no notable dental hx. (+) Dental Advisory Given, Teeth Intact   Pulmonary neg pulmonary ROS   Pulmonary exam normal breath sounds clear to auscultation       Cardiovascular negative cardio ROS Normal cardiovascular exam Rhythm:Regular Rate:Normal     Neuro/Psych  Headaches PSYCHIATRIC DISORDERS Anxiety Depression       GI/Hepatic negative GI ROS, Neg liver ROS,,,  Endo/Other  negative endocrine ROS    Renal/GU negative Renal ROS     Musculoskeletal negative musculoskeletal ROS (+)    Abdominal   Peds  Hematology negative hematology ROS (+)   Anesthesia Other Findings   Reproductive/Obstetrics                             Anesthesia Physical Anesthesia Plan  ASA: 2  Anesthesia Plan: General   Post-op Pain Management: Ofirmev  IV (intra-op)*   Induction: Intravenous  PONV Risk Score and Plan: 3 and Ondansetron , Dexamethasone, Treatment may vary due to age or medical condition and Midazolam  Airway Management Planned: Oral ETT  Additional Equipment: None  Intra-op Plan:   Post-operative Plan: Extubation in OR  Informed Consent: I have reviewed the patients History and Physical, chart, labs and discussed the procedure including the risks, benefits and alternatives for the proposed anesthesia with the patient or authorized representative who has indicated his/her understanding and acceptance.     Dental advisory given  Plan Discussed with: CRNA  Anesthesia Plan Comments:         Anesthesia Quick Evaluation

## 2023-11-14 NOTE — H&P (Signed)
 Garrett Booker is an 23 y.o. male.    Chief Complaint:  Nasal obstruction  HPI: Patient presents today for planned elective procedure.  He/she denies any interval change in history since office visit on 11/06/23.   Past Medical History:  Diagnosis Date   Abdominal pain    ADHD    Allergic rhinitis    Anxiety    Complication of anesthesia    slow to wake up   Delayed sleep phase syndrome    Dermatographism    Dizziness    Flat feet, bilateral    Inattention    Keratosis    Major depression    Migraine without aura and without status migrainosus, not intractable    Vitamin D  deficiency    Vitiligo     Past Surgical History:  Procedure Laterality Date   TONSILLECTOMY      Family History  Problem Relation Age of Onset   Celiac disease Neg Hx    Ulcers Neg Hx    Thyroid  disease Mother    Cancer Maternal Grandmother        breast   Thyroid  disease Maternal Grandmother    Diabetes Maternal Grandfather     Social History:  reports that he has never smoked. He has never been exposed to tobacco smoke. He has never used smokeless tobacco. He reports that he does not drink alcohol and does not use drugs.  Allergies:  Allergies  Allergen Reactions   Albuterol Rash    Facility-Administered Medications Prior to Admission  Medication Dose Route Frequency Provider Last Rate Last Admin   0.9 %  sodium chloride  infusion  500 mL Intravenous Once Cunningham, Scott E, MD       Medications Prior to Admission  Medication Sig Dispense Refill   cetirizine (ZYRTEC) 10 MG tablet Take 10 mg by mouth daily.     fluticasone (FLONASE) 50 MCG/ACT nasal spray Place 1 spray into both nostrils daily.     ibuprofen  (ADVIL ,MOTRIN ) 200 MG tablet Take 200 mg by mouth every 6 (six) hours as needed.     lamoTRIgine (LAMICTAL) 25 MG tablet Take 50 mg by mouth daily.     omeprazole  (PRILOSEC) 40 MG capsule TAKE 1 CAPSULE(40 MG) BY MOUTH DAILY 30 capsule 1   sertraline (ZOLOFT) 100 MG tablet  Take 100 mg by mouth daily.      No results found for this or any previous visit (from the past 48 hours). No results found.  ROS: negative other than stated in HPI  Blood pressure 128/76, pulse 64, temperature (!) 97.2 F (36.2 C), temperature source Tympanic, resp. rate 15, height 6\' 3"  (1.905 m), weight 101.9 kg, SpO2 98%.  PHYSICAL EXAM: General: Resting comfortably in NAD  Lungs: Non-labored respiratinos  Studies Reviewed: none   Assessment/Plan Nasal septal deviation Nasal valve collapse Inferior turbinate hypertrophy  Proceed with open septorhinoplasty, repair nasal vestibular stenosis, inferior turbinate reduction. Informed consent obtained. R/B/A/ discussed. Risks discussed in detail including pain, bleeding, infection, nasal deformity, poor cosmesis, nasal tip firmness, nasal numbness, graft infection/failure/extrusion, failure to relieve symptoms, nasal septal perforation, nasal synechiae, need for further surgery, risks of anesthesia. Despite these risks the patient requested to proceed.      Electronically signed by:  Rush Coupe, MD  Staff Physician Facial Plastic & Reconstructive Surgery Otolaryngology - Head and Neck Surgery [facility] [place]  11/14/2023, 8:16 AM

## 2023-11-15 ENCOUNTER — Encounter (HOSPITAL_BASED_OUTPATIENT_CLINIC_OR_DEPARTMENT_OTHER): Payer: Self-pay | Admitting: Otolaryngology

## 2024-01-22 ENCOUNTER — Encounter (HOSPITAL_BASED_OUTPATIENT_CLINIC_OR_DEPARTMENT_OTHER): Payer: Self-pay | Admitting: *Deleted

## 2024-01-22 ENCOUNTER — Other Ambulatory Visit: Payer: Self-pay

## 2024-01-22 ENCOUNTER — Emergency Department (HOSPITAL_BASED_OUTPATIENT_CLINIC_OR_DEPARTMENT_OTHER)
Admission: EM | Admit: 2024-01-22 | Discharge: 2024-01-22 | Disposition: A | Discharge status: Home / Self Care | Attending: Emergency Medicine | Admitting: Emergency Medicine

## 2024-01-22 ENCOUNTER — Other Ambulatory Visit (HOSPITAL_BASED_OUTPATIENT_CLINIC_OR_DEPARTMENT_OTHER): Payer: Self-pay

## 2024-01-22 ENCOUNTER — Emergency Department (HOSPITAL_BASED_OUTPATIENT_CLINIC_OR_DEPARTMENT_OTHER): Admitting: Radiology

## 2024-01-22 DIAGNOSIS — R110 Nausea: Secondary | ICD-10-CM | POA: Diagnosis not present

## 2024-01-22 DIAGNOSIS — R42 Dizziness and giddiness: Secondary | ICD-10-CM | POA: Diagnosis not present

## 2024-01-22 DIAGNOSIS — R1013 Epigastric pain: Secondary | ICD-10-CM | POA: Insufficient documentation

## 2024-01-22 DIAGNOSIS — R197 Diarrhea, unspecified: Secondary | ICD-10-CM | POA: Insufficient documentation

## 2024-01-22 DIAGNOSIS — R072 Precordial pain: Secondary | ICD-10-CM | POA: Insufficient documentation

## 2024-01-22 LAB — BASIC METABOLIC PANEL WITH GFR
Anion gap: 15 (ref 5–15)
BUN: 13 mg/dL (ref 6–20)
CO2: 24 mmol/L (ref 22–32)
Calcium: 10.1 mg/dL (ref 8.9–10.3)
Chloride: 101 mmol/L (ref 98–111)
Creatinine, Ser: 0.9 mg/dL (ref 0.61–1.24)
GFR, Estimated: >60 mL/min (ref >60)
Glucose, Bld: 107 mg/dL — ABNORMAL HIGH (ref 70–99)
Potassium: 3.9 mmol/L (ref 3.5–5.1)
Sodium: 140 mmol/L (ref 135–145)

## 2024-01-22 LAB — LIPASE, BLOOD: Lipase: 24 U/L (ref 11–51)

## 2024-01-22 LAB — HEPATIC FUNCTION PANEL
ALT: 20 U/L (ref 0–44)
AST: 24 U/L (ref 15–41)
Albumin: 5.2 g/dL — ABNORMAL HIGH (ref 3.5–5.0)
Alkaline Phosphatase: 93 U/L (ref 38–126)
Bilirubin, Direct: 0.1 mg/dL (ref 0.0–0.2)
Indirect Bilirubin: 0.3 mg/dL (ref 0.3–0.9)
Total Bilirubin: 0.4 mg/dL (ref 0.0–1.2)
Total Protein: 7.9 g/dL (ref 6.5–8.1)

## 2024-01-22 LAB — CBC
HCT: 42.6 % (ref 39.0–52.0)
Hemoglobin: 14.9 g/dL (ref 13.0–17.0)
MCH: 29.9 pg (ref 26.0–34.0)
MCHC: 35 g/dL (ref 30.0–36.0)
MCV: 85.4 fL (ref 80.0–100.0)
Platelets: 261 K/uL (ref 150–400)
RBC: 4.99 MIL/uL (ref 4.22–5.81)
RDW: 11.5 % (ref 11.5–15.5)
WBC: 4.5 K/uL (ref 4.0–10.5)
nRBC: 0 % (ref 0.0–0.2)

## 2024-01-22 LAB — TROPONIN T, HIGH SENSITIVITY: Troponin T High Sensitivity: <15 ng/L (ref <19)

## 2024-01-22 MED ORDER — ALUM & MAG HYDROXIDE-SIMETH 200-200-20 MG/5ML PO SUSP
30.0000 mL | Freq: Once | ORAL | Status: AC
  Administered 2024-01-22: 30 mL via ORAL
  Filled 2024-01-22 (×2): qty 30

## 2024-01-22 MED ORDER — ONDANSETRON 4 MG PO TBDP
4.0000 mg | ORAL_TABLET | Freq: Three times a day (TID) | ORAL | 0 refills | Status: DC | PRN
  Filled 2024-01-22: qty 20, 7d supply, fill #0

## 2024-01-22 MED ORDER — LIDOCAINE VISCOUS HCL 2 % MT SOLN
15.0000 mL | Freq: Once | OROMUCOSAL | Status: AC
  Administered 2024-01-22: 15 mL via ORAL
  Filled 2024-01-22: qty 15

## 2024-01-22 MED ORDER — SUCRALFATE 1 G PO TABS
1.0000 g | ORAL_TABLET | Freq: Three times a day (TID) | ORAL | 0 refills | Status: DC
  Filled 2024-01-22: qty 11, 3d supply, fill #0
  Filled 2024-01-22: qty 56, 14d supply, fill #0
  Filled 2024-01-22: qty 11, 3d supply, fill #0
  Filled 2024-01-22: qty 45, 11d supply, fill #0

## 2024-01-22 NOTE — Discharge Instructions (Signed)
 It was a pleasure taking care of you here today  Your workup today was reassuring.  Continue taking the reflux medicine at home.  I have added on Carafate .  He will take this 4 times daily, once with each of her meals as well as bedtime.  I prescribed you an additional medication called Zofran  for any additional nausea or vomiting you may have.  Return for any new or worsening symptoms.

## 2024-01-22 NOTE — ED Triage Notes (Signed)
 Pt to ED reporting chest pain with dizziness and nausea x 2 days. Patient denies fever at home but intermittent feelings of hot flashes and chills. No vomiting, diarrhea or cough.

## 2024-01-22 NOTE — ED Provider Notes (Signed)
 [place] EMERGENCY DEPARTMENT AT [facility] Provider Note   CSN: [id number] Arrival date & time: 01/22/24  1229     Patient presents with: Chest Pain and Dizziness   Garrett Booker is a 23 y.o. male here for eval ration of chest pain Donnell pain, nausea and dizziness that started 2 days ago.  No fever at home however has had some chills.  Has had persistent nausea without vomiting.  Slightly looser stools without any melena or blood per rectum.  No cough, back pain, pain to left arm.  Symptoms are nonexertional, nonpleuritic in nature.  Seems to think of in the epigastric region up into his throat.  Occasionally feels like he has difficult time swallowing.  No appetite earlier today.  Unsure if symptoms are worse when eating or drinking.  No history of PE or DVT. No unilateral leg swelling.  No chronic NSAID use, EtOH use.  No history of GERD/PUD   Surgery 10 weeks ago    HPI     Prior to Admission medications   Medication Sig Start Date End Date Taking? Authorizing Provider  ondansetron  (ZOFRAN -ODT) 4 MG disintegrating tablet Take 1 tablet (4 mg total) by mouth every 8 (eight) hours as needed. 01/22/24  Yes Terri Malerba A, PA-C  sucralfate  (CARAFATE ) 1 g tablet Take 1 tablet (1 g total) by mouth 4 (four) times daily -  with meals and at bedtime for 14 days. 01/22/24 02/05/24 Yes Anasha Perfecto A, PA-C  cetirizine (ZYRTEC) 10 MG tablet Take 10 mg by mouth daily.    [provider]  fluticasone (FLONASE) 50 MCG/ACT nasal spray Place 1 spray into both nostrils daily.    [provider]  ibuprofen  (ADVIL ,MOTRIN ) 200 MG tablet Take 200 mg by mouth every 6 (six) hours as needed.    [provider]  lamoTRIgine (LAMICTAL) 25 MG tablet Take 50 mg by mouth daily. 03/02/22   [provider]  methylPREDNISolone  (MEDROL  DOSEPAK) 4 MG TBPK tablet Follow instructions on package 11/14/23   Luciano Standing, MD  omeprazole  (PRILOSEC) 40 MG capsule TAKE 1  CAPSULE(40 MG) BY MOUTH DAILY 07/05/21   Cunningham, Scott E, MD  sertraline (ZOLOFT) 100 MG tablet Take 100 mg by mouth daily. 01/30/21   [provider]  sodium chloride  (OCEAN) 0.65 % SOLN nasal spray Place 2 sprays into both nostrils 4 (four) times daily. 11/14/23 12/14/23  Luciano Standing, MD    Allergies: Albuterol    Review of Systems  Constitutional: Negative.   HENT: Negative.    Respiratory: Negative.    Cardiovascular:  Positive for chest pain. Negative for palpitations and leg swelling.  Gastrointestinal:  Positive for abdominal pain and nausea. Negative for abdominal distention, anal bleeding, blood in stool, constipation, rectal pain and vomiting.  Genitourinary: Negative.   Musculoskeletal: Negative.   Skin: Negative.   Neurological: Negative.   All other systems reviewed and are negative.   Updated Vital Signs BP (!) 131/96   Pulse 65   Temp (P) 98.5 F (36.9 C) (Oral)   Resp 14   SpO2 98%   Physical Exam Vitals and nursing note reviewed.  Constitutional:      General: He is not in acute distress.    Appearance: He is well-developed. He is not ill-appearing, toxic-appearing or diaphoretic.  HENT:     Head: Atraumatic.  Eyes:     Pupils: Pupils are equal, round, and reactive to light.  Cardiovascular:     Rate and Rhythm: Normal rate and  regular rhythm.     Pulses:          Radial pulses are 2+ on the right side and 2+ on the left side.     Heart sounds: Normal heart sounds.  Pulmonary:     Effort: Pulmonary effort is normal. No respiratory distress.     Breath sounds: Normal breath sounds.     Comments: Clear, speaks in full sentences without difficulty Chest:       Comments: Tenderness epigastric, inferior sternal area without crepitus or step-off. Abdominal:     General: Bowel sounds are normal. There is no distension.     Palpations: Abdomen is soft.     Tenderness: There is abdominal tenderness in the epigastric area. There is no guarding or  rebound. Negative signs include Murphy's sign.      Comments: Tenderness epigastric region, negative Murphy sign.  No rebound or guarding.  Musculoskeletal:        General: Normal range of motion.     Cervical back: Normal range of motion and neck supple.     Comments: Compartment soft, full rom  Skin:    General: Skin is warm and dry.  Neurological:     General: No focal deficit present.     Mental Status: He is alert and oriented to person, place, and time.     (all labs ordered are listed, but only abnormal results are displayed) Labs Reviewed  BASIC METABOLIC PANEL WITH GFR - Abnormal; Notable for the following components:      Result Value   Glucose, Bld 107 (*)    All other components within normal limits  HEPATIC FUNCTION PANEL - Abnormal; Notable for the following components:   Albumin 5.2 (*)    All other components within normal limits  CBC  LIPASE, BLOOD  TROPONIN T, HIGH SENSITIVITY    EKG: None  Radiology: DG Chest 2 View Result Date: 01/22/2024 CLINICAL DATA:  Chest pain. EXAM: CHEST - 2 VIEW COMPARISON:  03/02/2021. FINDINGS: The heart size and mediastinal contours are within normal limits. Both lungs are clear. No pleural effusion or pneumothorax. No acute osseous abnormality. IMPRESSION: No acute cardiopulmonary findings. Electronically Signed   By: Harrietta Sherry M.D.   On: 01/22/2024 13:10     Procedures   Medications Ordered in the ED  alum & mag hydroxide-simeth (MAALOX/MYLANTA) 200-200-20 MG/5ML suspension 30 mL (30 mLs Oral Given 01/22/24 1423)    And  lidocaine  (XYLOCAINE ) 2 % viscous mouth solution 15 mL (15 mLs Oral Given 01/22/24 1423)   Here for evaluation epigastric pain and chest pain.  Started 2 days ago.  Nonexertional, nonpleuritic in nature.  Associated nausea.  Starts in epigastric region and goes into his chest.  Overall decreased appetite.  Does not radiate to arm or jaw.  Had a surgery about 10 weeks ago, and unilateral leg swelling.   Nonpleuritic symptoms.  He is PERC negative, Wells criteria low risk.  Heart score 0. abdomen mildly tender to epigastric region however negative Murphy sign.  No blood in stool.  No chronic EtOH use, NSAID use.  Will plan on labs, imaging and reassess.  Labs and imaging personally viewed and interpreted:  CBC without leukocytosis Metabolic panel without significant abnormality Hepatic function panel normal LFTs Lipase 24 Troponin within normal limits Chest x-ray without acute abnormality EKG without ischemic changes  Patient reassessed.  Given migraine cocktail.  Improvement in symptoms.  I suspect his symptoms likely due to GERD/PUD.  At this time  I low suspicion for acute ACS, PE, dissection, [place], endocarditis, myocarditis, pericarditis, cholecystitis, symptomatic cholelithiasis, choledocholithiasis, perforated viscus, obstruction, bacterial infectious process, GI bleed.  Will start on Carafate .  He started taking PPI 2 days ago.  Urged to continue this.  He will return for any worsening symptoms.  The patient has been appropriately medically screened and/or stabilized in the ED. I have low suspicion for any other emergent medical condition which would require further screening, evaluation or treatment in the ED or require inpatient management.  Patient is hemodynamically stable and in no acute distress.  Patient able to ambulate in department prior to ED.  Evaluation does not show acute pathology that would require ongoing or additional emergent interventions while in the emergency department or further inpatient treatment.  I have discussed the diagnosis with the patient and answered all questions.  Pain is been managed while in the emergency department and patient has no further complaints prior to discharge.  Patient is comfortable with plan discussed in room and is stable for discharge at this time.  I have discussed strict return precautions for returning to the emergency department.   Patient was encouraged to follow-up with PCP/specialist refer to at discharge.                                   Medical Decision Making Amount and/or Complexity of Data Reviewed Independent Historian: parent External Data Reviewed: labs, radiology, ECG and notes. Labs: ordered. Decision-making details documented in ED Course. Radiology: ordered and independent interpretation performed. Decision-making details documented in ED Course. ECG/medicine tests: ordered and independent interpretation performed. Decision-making details documented in ED Course.  Risk OTC drugs. Prescription drug management. Decision regarding hospitalization. Diagnosis or treatment significantly limited by social determinants of health.        Final diagnoses:  Epigastric pain  Nausea  Precordial pain    ED Discharge Orders          Ordered    sucralfate  (CARAFATE ) 1 g tablet  3 times daily with meals & bedtime        01/22/24 1505    ondansetron  (ZOFRAN -ODT) 4 MG disintegrating tablet  Every 8 hours PRN        01/22/24 1505               Meesha Sek A, PA-C 01/22/24 1520    Zackowski, Scott, MD 01/23/24 [phone]

## 2024-01-23 ENCOUNTER — Other Ambulatory Visit (HOSPITAL_BASED_OUTPATIENT_CLINIC_OR_DEPARTMENT_OTHER): Payer: Self-pay

## 2024-01-31 ENCOUNTER — Emergency Department (HOSPITAL_BASED_OUTPATIENT_CLINIC_OR_DEPARTMENT_OTHER)

## 2024-01-31 ENCOUNTER — Other Ambulatory Visit: Payer: Self-pay

## 2024-01-31 ENCOUNTER — Encounter (HOSPITAL_BASED_OUTPATIENT_CLINIC_OR_DEPARTMENT_OTHER): Payer: Self-pay

## 2024-01-31 ENCOUNTER — Emergency Department (HOSPITAL_BASED_OUTPATIENT_CLINIC_OR_DEPARTMENT_OTHER): Admitting: Radiology

## 2024-01-31 ENCOUNTER — Emergency Department (HOSPITAL_BASED_OUTPATIENT_CLINIC_OR_DEPARTMENT_OTHER)
Admission: EM | Admit: 2024-01-31 | Discharge: 2024-01-31 | Disposition: A | Discharge status: Home / Self Care | Attending: Emergency Medicine | Admitting: Emergency Medicine

## 2024-01-31 DIAGNOSIS — I880 Nonspecific mesenteric lymphadenitis: Secondary | ICD-10-CM | POA: Insufficient documentation

## 2024-01-31 DIAGNOSIS — R109 Unspecified abdominal pain: Secondary | ICD-10-CM | POA: Diagnosis present

## 2024-01-31 DIAGNOSIS — R0789 Other chest pain: Secondary | ICD-10-CM | POA: Insufficient documentation

## 2024-01-31 LAB — COMPREHENSIVE METABOLIC PANEL WITH GFR
ALT: 21 U/L (ref 0–44)
AST: 23 U/L (ref 15–41)
Albumin: 5 g/dL (ref 3.5–5.0)
Alkaline Phosphatase: 104 U/L (ref 38–126)
Anion gap: 16 — ABNORMAL HIGH (ref 5–15)
BUN: 13 mg/dL (ref 6–20)
CO2: 23 mmol/L (ref 22–32)
Calcium: 10 mg/dL (ref 8.9–10.3)
Chloride: 102 mmol/L (ref 98–111)
Creatinine, Ser: 0.91 mg/dL (ref 0.61–1.24)
GFR, Estimated: >60 mL/min (ref >60)
Glucose, Bld: 103 mg/dL — ABNORMAL HIGH (ref 70–99)
Potassium: 3.9 mmol/L (ref 3.5–5.1)
Sodium: 140 mmol/L (ref 135–145)
Total Bilirubin: 0.3 mg/dL (ref 0.0–1.2)
Total Protein: 7.9 g/dL (ref 6.5–8.1)

## 2024-01-31 LAB — CBC WITH DIFFERENTIAL/PLATELET
Abs Immature Granulocytes: 0.01 K/uL (ref 0.00–0.07)
Basophils Absolute: 0 K/uL (ref 0.0–0.1)
Basophils Relative: 1 %
Eosinophils Absolute: 0.2 K/uL (ref 0.0–0.5)
Eosinophils Relative: 3 %
HCT: 45.1 % (ref 39.0–52.0)
Hemoglobin: 15.6 g/dL (ref 13.0–17.0)
Immature Granulocytes: 0 %
Lymphocytes Relative: 28 %
Lymphs Abs: 1.5 K/uL (ref 0.7–4.0)
MCH: 29.4 pg (ref 26.0–34.0)
MCHC: 34.6 g/dL (ref 30.0–36.0)
MCV: 85.1 fL (ref 80.0–100.0)
Monocytes Absolute: 0.6 K/uL (ref 0.1–1.0)
Monocytes Relative: 12 %
Neutro Abs: 3.2 K/uL (ref 1.7–7.7)
Neutrophils Relative %: 56 %
Platelets: 292 K/uL (ref 150–400)
RBC: 5.3 MIL/uL (ref 4.22–5.81)
RDW: 11.5 % (ref 11.5–15.5)
WBC: 5.6 K/uL (ref 4.0–10.5)
nRBC: 0 % (ref 0.0–0.2)

## 2024-01-31 LAB — TROPONIN T, HIGH SENSITIVITY
Troponin T High Sensitivity: <15 ng/L
Troponin T High Sensitivity: <15 ng/L (ref <19)

## 2024-01-31 LAB — LIPASE, BLOOD: Lipase: 27 U/L (ref 11–51)

## 2024-01-31 MED ORDER — ONDANSETRON HCL 4 MG/2ML IJ SOLN
4.0000 mg | Freq: Once | INTRAMUSCULAR | Status: AC
  Administered 2024-01-31: 4 mg via INTRAVENOUS
  Filled 2024-01-31: qty 2

## 2024-01-31 MED ORDER — PANTOPRAZOLE SODIUM 40 MG IV SOLR
40.0000 mg | Freq: Once | INTRAVENOUS | Status: AC
  Administered 2024-01-31: 40 mg via INTRAVENOUS
  Filled 2024-01-31: qty 10

## 2024-01-31 MED ORDER — ALUM & MAG HYDROXIDE-SIMETH 200-200-20 MG/5ML PO SUSP
30.0000 mL | Freq: Once | ORAL | Status: AC
  Administered 2024-01-31: 30 mL via ORAL
  Filled 2024-01-31: qty 30

## 2024-01-31 MED ORDER — IOHEXOL 300 MG/ML  SOLN
100.0000 mL | Freq: Once | INTRAMUSCULAR | Status: AC | PRN
  Administered 2024-01-31: 100 mL via INTRAVENOUS

## 2024-01-31 MED ORDER — PANTOPRAZOLE SODIUM 20 MG PO TBEC: 40.0000 mg | DELAYED_RELEASE_TABLET | Freq: Every day | ORAL | 0 refills | Status: DC

## 2024-01-31 MED ORDER — DICYCLOMINE HCL 10 MG/ML IM SOLN
20.0000 mg | Freq: Once | INTRAMUSCULAR | Status: AC
  Administered 2024-01-31: 20 mg via INTRAMUSCULAR
  Filled 2024-01-31: qty 2

## 2024-01-31 NOTE — ED Provider Notes (Signed)
 [place] EMERGENCY DEPARTMENT AT [facility] Provider Note   CSN: [id number] Arrival date & time: 01/31/24  1246     Patient presents with: Abdominal Pain (epigastric)   Garrett Booker is a 23 y.o. male.  {Add pertinent medical, surgical, social history, OB history to HPI:32947} HPI     23yo male male with history of ADHD, MDD, anxiety,who presents with concern for abdominal pain.  Past Medical History:  Diagnosis Date   Abdominal pain    ADHD    Allergic rhinitis    Anxiety    Complication of anesthesia    slow to wake up   Delayed sleep phase syndrome    Dermatographism    Dizziness    Flat feet, bilateral    Inattention    Keratosis    Major depression    Migraine without aura and without status migrainosus, not intractable    Vitamin D  deficiency    Vitiligo     Past Surgical History:  Procedure Laterality Date   NASAL RECONSTRUCTION WITH SEPTAL REPAIR N/A 11/14/2023   Procedure: RHINOPLASTY WITH REPAIR OF VESTIBULAR STENOSIS;  Surgeon: Luciano Standing, MD;  Location: [facility];  Service: ENT;  Laterality: N/A;   NASAL SEPTOPLASTY W/ TURBINOPLASTY Bilateral 11/14/2023   Procedure: SEPTOPLASTY WITH BILATERAL INFERIOR TURBINATE REDUCTION;  Surgeon: Luciano Standing, MD;  Location: [facility];  Service: ENT;  Laterality: Bilateral;   TONSILLECTOMY      Prior to Admission medications   Medication Sig Start Date End Date Taking? Authorizing Provider  cetirizine (ZYRTEC) 10 MG tablet Take 10 mg by mouth daily.    [provider]  fluticasone (FLONASE) 50 MCG/ACT nasal spray Place 1 spray into both nostrils daily.    [provider]  ibuprofen  (ADVIL ,MOTRIN ) 200 MG tablet Take 200 mg by mouth every 6 (six) hours as needed.    [provider]  lamoTRIgine (LAMICTAL) 25 MG tablet Take 50 mg by mouth daily. 03/02/22   [provider]  methylPREDNISolone  (MEDROL  DOSEPAK) 4 MG TBPK tablet Follow  instructions on package 11/14/23   Luciano Standing, MD  omeprazole  (PRILOSEC) 40 MG capsule TAKE 1 CAPSULE(40 MG) BY MOUTH DAILY 07/05/21   Stacia Glendia BRAVO, MD  ondansetron  (ZOFRAN -ODT) 4 MG disintegrating tablet Take 1 tablet (4 mg total) by mouth every 8 (eight) hours as needed. 01/22/24   Henderly, Britni A, PA-C  sertraline (ZOLOFT) 100 MG tablet Take 100 mg by mouth daily. 01/30/21   [provider]  sodium chloride  (OCEAN) 0.65 % SOLN nasal spray Place 2 sprays into both nostrils 4 (four) times daily. 11/14/23 12/14/23  Luciano Standing, MD  sucralfate  (CARAFATE ) 1 g tablet Take 1 tablet (1 g total) by mouth 4 (four) times daily -  with meals and at bedtime for 14 days. 01/22/24 02/05/24  Henderly, Britni A, PA-C    Allergies: Albuterol    Review of Systems  Updated Vital Signs BP (!) 140/77   Pulse 68   Temp 97.6 F (36.4 C)   Resp 18   SpO2 98%   Physical Exam  (all labs ordered are listed, but only abnormal results are displayed) Labs Reviewed  COMPREHENSIVE METABOLIC PANEL WITH GFR - Abnormal; Notable for the following components:      Result Value   Glucose, Bld 103 (*)    Anion gap 16 (*)    All other components within normal limits  CBC WITH DIFFERENTIAL/PLATELET  LIPASE, BLOOD    EKG: EKG Interpretation Date/Time:  Thursday January 31 2024 13:04:57 EDT Ventricular Rate:  69 PR Interval:  120 QRS Duration:  98 QT Interval:  379 QTC Calculation: 406 R Axis:   86  Text Interpretation: Sinus rhythm Consider right ventricular hypertrophy Confirmed by Ruthe Cornet [phone]) on 01/31/2024 1:06:31 PM  Radiology: [facility] Chest 2 View Result Date: 01/31/2024 CLINICAL DATA:  Chest pain. EXAM: CHEST - 2 VIEW COMPARISON:  Chest CT dated 01/22/2024. FINDINGS: The heart size and mediastinal contours are within normal limits. Both lungs are clear. The visualized skeletal structures are unremarkable. IMPRESSION: No active cardiopulmonary disease. Electronically Signed   By: Vanetta Chou M.D.   On: 01/31/2024 14:03    {Document cardiac monitor, telemetry assessment procedure when appropriate:32947} Procedures   Medications Ordered in the ED - No data to display    {Click here for ABCD2, HEART and other calculators REFRESH Note before signing:1}                              Medical Decision Making  ***  23 year old male with a history   Labs completed and personally eval and interpreted by me show no evidence of anemia, no leukocytosis, no clinically significant electrolyte abnormalities.  No evidence of transaminitis or pancreatitis.  There is a very mild anion gap without acidosis.  EKG completed and personally evaluated interpreted by me is unchanged from prior with a normal sinus rhythm.  Chest x-ray without evidence of pneumonia, pneumothorax or pulmonary edema.  {Document critical care time when appropriate  Document review of labs and clinical decision tools ie CHADS2VASC2, etc  Document your independent review of radiology images and any outside records  Document your discussion with family members, caretakers and with consultants  Document social determinants of health affecting pt's care  Document your decision making why or why not admission, treatments were needed:32947:::1}   Final diagnoses:  None    ED Discharge Orders     None

## 2024-01-31 NOTE — ED Triage Notes (Signed)
 Pt c/o continued burning/ tightness in chest/ stomach, a lot of trouble breathing. Here for same approx 1wk ago, dx GERD, advsises no symptom improvement

## 2024-01-31 NOTE — ED Notes (Signed)
 Pt states his pain is getting worse 8/10 More in his chest at this time.  Trop pending  EDP made aware

## 2024-01-31 NOTE — ED Notes (Signed)
Lab called to add trop to previous blood work

## 2024-01-31 NOTE — ED Notes (Signed)
 Pt c/o epigastric pain that goes into his chest.  Was seen here last week for the same.  Has tried carafate  and states he feels he had an allergic reaction(throat tightness) to it and has not taken it again.

## 2024-02-01 ENCOUNTER — Other Ambulatory Visit (HOSPITAL_COMMUNITY): Payer: Self-pay | Admitting: Otolaryngology

## 2024-02-01 DIAGNOSIS — E0789 Other specified disorders of thyroid: Secondary | ICD-10-CM

## 2024-02-04 ENCOUNTER — Other Ambulatory Visit: Payer: Self-pay

## 2024-02-04 ENCOUNTER — Emergency Department (HOSPITAL_COMMUNITY)

## 2024-02-04 ENCOUNTER — Emergency Department (HOSPITAL_COMMUNITY)
Admission: EM | Admit: 2024-02-04 | Discharge: 2024-02-04 | Disposition: A | Discharge status: Home / Self Care | Attending: Emergency Medicine | Admitting: Emergency Medicine

## 2024-02-04 DIAGNOSIS — R079 Chest pain, unspecified: Secondary | ICD-10-CM | POA: Diagnosis present

## 2024-02-04 DIAGNOSIS — K30 Functional dyspepsia: Secondary | ICD-10-CM

## 2024-02-04 DIAGNOSIS — F419 Anxiety disorder, unspecified: Secondary | ICD-10-CM | POA: Insufficient documentation

## 2024-02-04 LAB — COMPREHENSIVE METABOLIC PANEL WITH GFR
ALT: 19 U/L (ref 0–44)
AST: 21 U/L (ref 15–41)
Albumin: 5.1 g/dL — ABNORMAL HIGH (ref 3.5–5.0)
Alkaline Phosphatase: 81 U/L (ref 38–126)
Anion gap: 15 (ref 5–15)
BUN: 14 mg/dL (ref 6–20)
CO2: 21 mmol/L — ABNORMAL LOW (ref 22–32)
Calcium: 10.2 mg/dL (ref 8.9–10.3)
Chloride: 102 mmol/L (ref 98–111)
Creatinine, Ser: 0.92 mg/dL (ref 0.61–1.24)
GFR, Estimated: >60 mL/min (ref >60)
Glucose, Bld: 102 mg/dL — ABNORMAL HIGH (ref 70–99)
Potassium: 3.7 mmol/L (ref 3.5–5.1)
Sodium: 138 mmol/L (ref 135–145)
Total Bilirubin: 1 mg/dL (ref 0.0–1.2)
Total Protein: 8.2 g/dL — ABNORMAL HIGH (ref 6.5–8.1)

## 2024-02-04 LAB — CBC WITH DIFFERENTIAL/PLATELET
Abs Immature Granulocytes: 0.01 K/uL (ref 0.00–0.07)
Basophils Absolute: 0 K/uL (ref 0.0–0.1)
Basophils Relative: 0 %
Eosinophils Absolute: 0.1 K/uL (ref 0.0–0.5)
Eosinophils Relative: 2 %
HCT: 45.6 % (ref 39.0–52.0)
Hemoglobin: 15.6 g/dL (ref 13.0–17.0)
Immature Granulocytes: 0 %
Lymphocytes Relative: 26 %
Lymphs Abs: 1.3 K/uL (ref 0.7–4.0)
MCH: 29.2 pg (ref 26.0–34.0)
MCHC: 34.2 g/dL (ref 30.0–36.0)
MCV: 85.4 fL (ref 80.0–100.0)
Monocytes Absolute: 0.4 K/uL (ref 0.1–1.0)
Monocytes Relative: 8 %
Neutro Abs: 3.1 K/uL (ref 1.7–7.7)
Neutrophils Relative %: 64 %
Platelets: 285 K/uL (ref 150–400)
RBC: 5.34 MIL/uL (ref 4.22–5.81)
RDW: 11.6 % (ref 11.5–15.5)
WBC: 4.9 K/uL (ref 4.0–10.5)
nRBC: 0 % (ref 0.0–0.2)

## 2024-02-04 LAB — LIPASE, BLOOD: Lipase: 31 U/L (ref 11–51)

## 2024-02-04 LAB — TROPONIN I (HIGH SENSITIVITY): Troponin I (High Sensitivity): 3 ng/L (ref <18)

## 2024-02-04 MED ORDER — DIAZEPAM 5 MG PO TABS
5.0000 mg | ORAL_TABLET | Freq: Once | ORAL | Status: AC
  Administered 2024-02-04: 5 mg via ORAL
  Filled 2024-02-04: qty 1

## 2024-02-04 MED ORDER — ALUM & MAG HYDROXIDE-SIMETH 200-200-20 MG/5ML PO SUSP
30.0000 mL | Freq: Once | ORAL | Status: AC
  Administered 2024-02-04: 30 mL via ORAL
  Filled 2024-02-04: qty 30

## 2024-02-04 MED ORDER — IOHEXOL 350 MG/ML SOLN
75.0000 mL | Freq: Once | INTRAVENOUS | Status: AC | PRN
  Administered 2024-02-04: 75 mL via INTRAVENOUS

## 2024-02-04 MED ORDER — FAMOTIDINE 40 MG/5ML PO SUSR: 20.0000 mg | Freq: Two times a day (BID) | ORAL | 0 refills | Status: DC

## 2024-02-04 MED ORDER — DIAZEPAM 5 MG PO TABS: 5.0000 mg | ORAL_TABLET | Freq: Two times a day (BID) | ORAL | 0 refills | Status: AC

## 2024-02-04 MED ORDER — LEVALBUTEROL HCL 0.63 MG/3ML IN NEBU
0.6300 mg | INHALATION_SOLUTION | Freq: Once | RESPIRATORY_TRACT | Status: AC
  Administered 2024-02-04: 0.63 mg via RESPIRATORY_TRACT
  Filled 2024-02-04: qty 3

## 2024-02-04 NOTE — ED Triage Notes (Signed)
 Patient to ED by POV with c/o chest tightness. Per patient he was seen at [facility] twice and was DX with acid reflux. He reports no relief and wanted to f/u in ED. He voices HX of anxiety currently taking Zoloft.

## 2024-02-04 NOTE — ED Provider Notes (Signed)
 [place] EMERGENCY DEPARTMENT AT [facility] Provider Note  CSN: [id number] Arrival date & time: 02/04/24 1001  Chief Complaint(s) Chest Pain  HPI Garrett Booker is a 23 y.o. male with past medical history as below, significant for MDD, migraine, ADHD, LPRD who presents to the ED with complaint of chest tightness, dyspnea  Reports feeling unwell for approximately 2 weeks now.  Seen in the ER twice and also seen at ENT office.  Symptoms attributed to reflux, he has been evaluation by PCP for possible PFT.  Reports worsening chest discomfort when eating or drinking.  When takes deep breath he also has midsternal chest tightness and pain.  When ambulates he feels winded.  He has nausea but no vomiting.  No change in bowel or bladder function.  No recent fevers, sick contacts, no leg swelling, history of DVT or PE.  Reports minimal improvement of the symptoms following Protonix   Past Medical History Past Medical History:  Diagnosis Date   Abdominal pain    ADHD    Allergic rhinitis    Anxiety    Complication of anesthesia    slow to wake up   Delayed sleep phase syndrome    Dermatographism    Dizziness    Flat feet, bilateral    Inattention    Keratosis    Major depression    Migraine without aura and without status migrainosus, not intractable    Vitamin D  deficiency    Vitiligo    Patient Active Problem List   Diagnosis Date Noted   Nasal deformity 08/17/2023   Dizziness 06/01/2015   Abnormal thyroid  blood test 11/06/2013   Vitiligo 11/06/2013   Hypovitaminosis D 11/06/2013   Simple constipation 09/17/2013   Periumbilical abdominal pain    Home Medication(s) Prior to Admission medications   Medication Sig Start Date End Date Taking? Authorizing Provider  cetirizine (ZYRTEC) 10 MG tablet Take 10 mg by mouth daily.    [provider]  fluticasone (FLONASE) 50 MCG/ACT nasal spray Place 1 spray into both nostrils daily.    [provider]   ibuprofen  (ADVIL ,MOTRIN ) 200 MG tablet Take 200 mg by mouth every 6 (six) hours as needed.    [provider]  lamoTRIgine (LAMICTAL) 25 MG tablet Take 50 mg by mouth daily. 03/02/22   [provider]  methylPREDNISolone  (MEDROL  DOSEPAK) 4 MG TBPK tablet Follow instructions on package 11/14/23   Luciano Standing, MD  ondansetron  (ZOFRAN -ODT) 4 MG disintegrating tablet Take 1 tablet (4 mg total) by mouth every 8 (eight) hours as needed. 01/22/24   Henderly, Britni A, PA-C  pantoprazole  (PROTONIX ) 20 MG tablet Take 2 tablets (40 mg total) by mouth daily for 14 days. 01/31/24 02/14/24  Dreama Longs, MD  sertraline (ZOLOFT) 100 MG tablet Take 100 mg by mouth daily. 01/30/21   [provider]  sodium chloride  (OCEAN) 0.65 % SOLN nasal spray Place 2 sprays into both nostrils 4 (four) times daily. 11/14/23 12/14/23  Luciano Standing, MD  Past Surgical History Past Surgical History:  Procedure Laterality Date   NASAL RECONSTRUCTION WITH SEPTAL REPAIR N/A 11/14/2023   Procedure: RHINOPLASTY WITH REPAIR OF VESTIBULAR STENOSIS;  Surgeon: Luciano Standing, MD;  Location: [facility];  Service: ENT;  Laterality: N/A;   NASAL SEPTOPLASTY W/ TURBINOPLASTY Bilateral 11/14/2023   Procedure: SEPTOPLASTY WITH BILATERAL INFERIOR TURBINATE REDUCTION;  Surgeon: Luciano Standing, MD;  Location: [facility];  Service: ENT;  Laterality: Bilateral;   TONSILLECTOMY     Family History Family History  Problem Relation Age of Onset   Celiac disease Neg Hx    Ulcers Neg Hx    Thyroid  disease Mother    Cancer Maternal Grandmother        breast   Thyroid  disease Maternal Grandmother    Diabetes Maternal Grandfather     Social History Social History   Tobacco Use   Smoking status: Never    Passive exposure: Never   Smokeless tobacco: Never   Vaping Use   Vaping status: Every Day   Substances: Nicotine, Flavoring  Substance Use Topics   Alcohol use: No   Drug use: No   Allergies Albuterol  Review of Systems A thorough review of systems was obtained and all systems are negative except as noted in the HPI and PMH.   Physical Exam Vital Signs  I have reviewed the triage vital signs BP 129/81 (BP Location: Right Arm)   Pulse 75   Temp 98 F (36.7 C)   Resp 16   Ht 6' 3 (1.905 m)   Wt 99.8 kg   SpO2 98%   BMI 27.50 kg/m  Physical Exam Vitals and nursing note reviewed.  Constitutional:      General: He is not in acute distress.    Appearance: He is well-developed.  HENT:     Head: Normocephalic and atraumatic.     Right Ear: External ear normal.     Left Ear: External ear normal.     Mouth/Throat:     Mouth: Mucous membranes are moist.  Eyes:     General: No scleral icterus. Cardiovascular:     Rate and Rhythm: Normal rate and regular rhythm.     Pulses: Normal pulses.     Heart sounds: Normal heart sounds. No murmur heard. Pulmonary:     Effort: Pulmonary effort is normal. No respiratory distress.     Breath sounds: Normal breath sounds.  Abdominal:     General: Abdomen is flat.     Palpations: Abdomen is soft.     Tenderness: There is no abdominal tenderness.  Musculoskeletal:     Cervical back: No rigidity.     Right lower leg: No edema.     Left lower leg: No edema.  Skin:    General: Skin is warm and dry.     Capillary Refill: Capillary refill takes less than 2 seconds.  Neurological:     Mental Status: He is alert.  Psychiatric:        Mood and Affect: Mood is anxious.        Behavior: Behavior normal.     ED Results and Treatments Labs (all labs ordered are listed, but only abnormal results are displayed) Labs Reviewed  COMPREHENSIVE METABOLIC PANEL WITH GFR - Abnormal; Notable for the following components:      Result Value   CO2 21 (*)    Glucose, Bld 102 (*)    Total Protein  8.2 (*)    Albumin 5.1 (*)  All other components within normal limits  CBC WITH DIFFERENTIAL/PLATELET  LIPASE, BLOOD  TROPONIN I (HIGH SENSITIVITY)                                                                                                                          Radiology CT Angio Chest PE W and/or Wo Contrast Result Date: 02/04/2024 CLINICAL DATA:  Pulmonary embolism (PE) suspected, high prob. Chest tightness. EXAM: CT ANGIOGRAPHY CHEST WITH CONTRAST TECHNIQUE: Multidetector CT imaging of the chest was performed using the standard protocol during bolus administration of intravenous contrast. Multiplanar CT image reconstructions and MIPs were obtained to evaluate the vascular anatomy. RADIATION DOSE REDUCTION: This exam was performed according to the departmental dose-optimization program which includes automated exposure control, adjustment of the mA and/or kV according to patient size and/or use of iterative reconstruction technique. CONTRAST:  75mL OMNIPAQUE  IOHEXOL  350 MG/ML SOLN COMPARISON:  Chest x-ray today. FINDINGS: Cardiovascular: Heart is normal size. Aorta is normal caliber. No filling defects in the pulmonary arteries to suggest pulmonary emboli. Mediastinum/Nodes: No mediastinal, hilar, or axillary adenopathy. Trachea and esophagus are unremarkable. Thyroid  unremarkable. Lungs/Pleura: Lungs are clear. No focal airspace opacities or suspicious nodules. No effusions. Upper Abdomen: No acute findings Musculoskeletal: Chest wall soft tissues are unremarkable. No acute bony abnormality. Review of the MIP images confirms the above findings. IMPRESSION: No evidence of pulmonary embolus. No acute cardiopulmonary disease. Electronically Signed   By: Franky Crease M.D.   On: 02/04/2024 12:29   DG Chest 2 View Result Date: 02/04/2024 EXAM: 2 VIEW(S) XRAY OF THE CHEST 02/04/2024 10:38:00 AM COMPARISON: 01/31/2024 CLINICAL HISTORY: Chest Pain. Pt having chest pain, trouble breathing, SOB.; Started  about 3 weeks ago, gotten worst past few days. FINDINGS: LUNGS AND PLEURA: No focal pulmonary opacity. No pulmonary edema. No pleural effusion. No pneumothorax. HEART AND MEDIASTINUM: No acute abnormality of the cardiac and mediastinal silhouettes. BONES AND SOFT TISSUES: No acute osseous abnormality. LINES AND TUBES: Leads and wires project over the chest on the frontal view. IMPRESSION: 1. No acute process. Electronically signed by: Rockey Kilts MD 02/04/2024 10:50 AM EDT RP Workstation: [id number]    Pertinent labs & imaging results that were available during my care of the patient were reviewed by me and considered in my medical decision making (see MDM for details).  Medications Ordered in ED Medications  alum & mag hydroxide-simeth (MAALOX/MYLANTA) 200-200-20 MG/5ML suspension 30 mL (30 mLs Oral Given 02/04/24 1225)  diazepam  (VALIUM ) tablet 5 mg (5 mg Oral Given 02/04/24 1225)  levalbuterol  (XOPENEX ) nebulizer solution 0.63 mg (0.63 mg Nebulization Given 02/04/24 1225)  iohexol  (OMNIPAQUE ) 350 MG/ML injection 75 mL (75 mLs Intravenous Contrast Given 02/04/24 1213)  Procedures Procedures  (including critical care time)  Medical Decision Making / ED Course    Medical Decision Making:    Maziah Keeling is a 23 y.o. male with past medical history as below, significant for MDD, migraine, ADHD, LPRD who presents to the ED with complaint of chest tightness, dyspnea. The complaint involves an extensive differential diagnosis and also carries with it a high risk of complications and morbidity.  Serious etiology was considered. Ddx includes but is not limited to: Differential includes all life-threatening causes for chest pain. This includes but is not exclusive to acute coronary syndrome, aortic dissection, pulmonary embolism, cardiac tamponade, community-acquired  pneumonia, pericarditis, musculoskeletal chest wall pain, etc.   Complete initial physical exam performed, notably the patient was in no acute distress, no hypoxia.    Reviewed and confirmed nursing documentation for past medical history, family history, social history.  Vital signs reviewed.    Chest tightness Dyspnea > - Patient with history of reflux, seen in the ER twice and the ENT once with same complaint - CT on 01/31/2024 concerning for mesenteric adenitis - He is low risk Wells score but given persistence of symptoms we will proceed with CT PE - CT PE was negative - Remains HDS, lab evaluation is stable including negative troponin. - Symptoms improved following Valium        The patient's chest pain is not suggestive of pulmonary embolus, cardiac ischemia, aortic dissection, pericarditis, myocarditis, pulmonary embolism, pneumothorax, pneumonia, Zoster, or esophageal perforation, or other serious etiology.  Historically not abrupt in onset, tearing or ripping, pulses symmetric. EKG nonspecific for ischemia/infarction. No dysrhythmias, brugada, WPW, prolonged QT noted.   Troponin negative x1, symptoms ongoing over 2 weeks, rpt trop is not necessary at this time. CXR reviewed. CTPE Labs without demonstration of acute pathology unless otherwise noted above. Low HEART Score: 0-3 points (0.9-1.7% risk of MACE).]   Given the extremely low risk of these diagnoses further testing and evaluation for these possibilities does not appear to be indicated at this time. Patient in no distress and overall condition improved here in the ED. Detailed discussions were had with the patient regarding current findings, and need for close f/u with PCP or on call doctor. The patient has been instructed to return immediately if the symptoms worsen in any way for re-evaluation. Patient verbalized understanding and is in agreement with current care plan. All questions answered prior to  discharge.                Additional history obtained: -Additional history obtained from family -External records from outside source obtained and reviewed including: Chart review including previous notes, labs, imaging, consultation notes including  Prior ER visits, allergy list, prior ENT evaluation   Lab Tests: -I ordered, reviewed, and interpreted labs.   The pertinent results include:   Labs Reviewed  COMPREHENSIVE METABOLIC PANEL WITH GFR - Abnormal; Notable for the following components:      Result Value   CO2 21 (*)    Glucose, Bld 102 (*)    Total Protein 8.2 (*)    Albumin 5.1 (*)    All other components within normal limits  CBC WITH DIFFERENTIAL/PLATELET  LIPASE, BLOOD  TROPONIN I (HIGH SENSITIVITY)    Notable for labs stable  EKG   EKG Interpretation Date/Time:    Ventricular Rate:    PR Interval:    QRS Duration:    QT Interval:    QTC Calculation:   R Axis:  Text Interpretation:           Imaging Studies ordered: I ordered imaging studies including chest x-ray, CT PE I independently visualized the following imaging with scope of interpretation limited to determining acute life threatening conditions related to emergency care; findings noted above I agree with the radiologist interpretation If any imaging was obtained with contrast I closely monitored patient for any possible adverse reaction a/w contrast administration in the emergency department   Medicines ordered and prescription drug management: Meds ordered this encounter  Medications   alum & mag hydroxide-simeth (MAALOX/MYLANTA) 200-200-20 MG/5ML suspension 30 mL   diazepam  (VALIUM ) tablet 5 mg   levalbuterol  (XOPENEX ) nebulizer solution 0.63 mg   iohexol  (OMNIPAQUE ) 350 MG/ML injection 75 mL    -I have reviewed the patients home medicines and have made adjustments as needed   Consultations Obtained: na   Cardiac Monitoring: The patient was maintained on a  cardiac monitor.  I personally viewed and interpreted the cardiac monitored which showed an underlying rhythm of: nsr Continuous pulse oximetry interpreted by myself, 99% on RA.    Social Determinants of Health:  Diagnosis or treatment significantly limited by social determinants of health: na   Reevaluation: After the interventions noted above, I reevaluated the patient and found that they have improved  Co morbidities that complicate the patient evaluation  Past Medical History:  Diagnosis Date   Abdominal pain    ADHD    Allergic rhinitis    Anxiety    Complication of anesthesia    slow to wake up   Delayed sleep phase syndrome    Dermatographism    Dizziness    Flat feet, bilateral    Inattention    Keratosis    Major depression    Migraine without aura and without status migrainosus, not intractable    Vitamin D  deficiency    Vitiligo       Dispostion: Disposition decision including need for hospitalization was considered, and patient discharged from emergency department.    Final Clinical Impression(s) / ED Diagnoses Final diagnoses:  Chest pain with low risk of acute coronary syndrome        Elnor Jayson LABOR, DO 02/04/24 1422

## 2024-02-04 NOTE — Discharge Instructions (Addendum)
 Return to the Emergency Department if you have unusual chest pain, pressure, or discomfort, shortness of breath, nausea, vomiting, burping, heartburn, tingling upper body parts, sweating, cold, clammy skin, or racing heartbeat. Call 911 if you think you are having a heart attack. Follow cardiac diet - avoid fatty & fried foods, don't eat too much red meat, eat lots of fruits & vegetables, and dairy products should be low fat. Please lose weight if you are overweight. Become more active with walking, gardening, or any other activity that gets you to moving.    You symptoms should start to improve, if you are still having epigastric discomfort after around 2 wks please call gastroenterology Dr Burnette to arrange follow up.   Please return to the emergency department immediately for any new or concerning symptoms, or if you get worse.

## 2024-02-05 ENCOUNTER — Ambulatory Visit (INDEPENDENT_AMBULATORY_CARE_PROVIDER_SITE_OTHER): Admitting: Internal Medicine

## 2024-02-05 ENCOUNTER — Encounter: Payer: Self-pay | Admitting: Internal Medicine

## 2024-02-05 VITALS — BP 129/78 | HR 73

## 2024-02-05 DIAGNOSIS — R0789 Other chest pain: Secondary | ICD-10-CM | POA: Diagnosis not present

## 2024-02-05 MED ORDER — PANTOPRAZOLE SODIUM 40 MG PO TBEC: DELAYED_RELEASE_TABLET | ORAL | 2 refills | Status: AC

## 2024-02-05 MED ORDER — FAMOTIDINE 20 MG PO TABS: ORAL_TABLET | ORAL | 11 refills | Status: AC

## 2024-02-05 NOTE — Progress Notes (Signed)
 Garrett Booker, male    DOB: 03-06-2001   MRN: [id number]   Brief patient profile:  23 yowm vapes  referred to pulmonary clinic 02/05/2024 by [facility] -ER  for chest pain/sob       Baseline = 30 min walk/jog  some hills uses treadmill when hot   3.2 mph no grade   H/o cp/ chest tightness dysphagia > EGD [place] 2022 and no f/u since     History of Present Illness  02/05/2024  Pulmonary/ 1st office eval/Inza Mikrut  Chief Complaint  Patient presents with   Consult    No history of breathing problems.  Sob chest tightness for 3 weeks.  Hard to take a deep breath in.    With exertion and while at rest.  Dyspnea: no longer excercising,chest feels tight even at rest  Cough: some urge to clear throat but no pnds or wheeze  Sleep: no resp cc  SABA use: none  02 [id number]:[id number] Dyphagia about 1/3 down in chest food feels like it's getting stuck and exac chest pain     No obvious day to day or daytime pattern/variability or assoc excess/ purulent sputum or mucus plugs or hemoptysis or  subjective wheeze or overt sinus or hb symptoms.    Also denies any obvious fluctuation of symptoms with weather or environmental changes or other aggravating or alleviating factors except as outlined above   No unusual exposure hx or h/o childhood pna/ asthma or knowledge of premature birth.  Current Allergies, Complete Past Medical History, Past Surgical History, Family History, and Social History were reviewed in [employer] record.  ROS  The following are not active complaints unless bolded Hoarseness, sore throat/globus dysphagia, dental problems, itching, sneezing,  nasal congestion or discharge of excess mucus or purulent secretions, ear ache,   fever, chills, sweats, unintended wt loss or wt gain, classically pleuritic or exertional cp,  orthopnea pnd or arm/hand swelling  or leg swelling, presyncope, palpitations, abdominal pain, anorexia, nausea, vomiting, diarrhea  or change in bowel  habits or change in bladder habits, change in stools or change in urine, dysuria, hematuria,  rash, arthralgias, visual complaints, headache, numbness, weakness or ataxia or problems with walking or coordination,  change in mood or  memory.             Outpatient Medications Prior to Visit  Medication Sig Dispense Refill   cetirizine (ZYRTEC) 10 MG tablet Take 10 mg by mouth daily. (Patient taking differently: Take 10 mg by mouth daily as needed for allergies or rhinitis.)     diazepam  (VALIUM ) 5 MG tablet Take 1 tablet (5 mg total) by mouth 2 (two) times daily. (Patient taking differently: Take 5 mg by mouth 2 (two) times daily as needed.) 10 tablet 0   famotidine  (PEPCID ) 40 MG/5ML suspension Take 2.5 mLs (20 mg total) by mouth 2 (two) times daily for 7 days. 50 mL 0   fluticasone (FLONASE) 50 MCG/ACT nasal spray Place 1 spray into both nostrils daily. (Patient taking differently: Place 1 spray into both nostrils daily as needed for allergies or rhinitis.)     ibuprofen  (ADVIL ,MOTRIN ) 200 MG tablet Take 200 mg by mouth every 6 (six) hours as needed.     lamoTRIgine (LAMICTAL) 25 MG tablet Take 50 mg by mouth daily.     ondansetron  (ZOFRAN -ODT) 4 MG disintegrating tablet Take 1 tablet (4 mg total) by mouth every 8 (eight) hours as needed. 20 tablet 0   pantoprazole  (PROTONIX ) 20 MG  tablet Take 2 tablets (40 mg total) by mouth daily for 14 days. 28 tablet 0   sertraline (ZOLOFT) 100 MG tablet Take 100 mg by mouth daily.     sodium chloride  (OCEAN) 0.65 % SOLN nasal spray Place 2 sprays into both nostrils 4 (four) times daily. 30 mL 1       0   Facility-Administered Medications Prior to Visit  Medication Dose Route Frequency Provider Last Rate Last Admin   0.9 %  sodium chloride  infusion  500 mL Intravenous Once Stacia Glendia BRAVO, MD        Past Medical History:  Diagnosis Date   Abdominal pain    ADHD    Allergic rhinitis    Anxiety    Complication of anesthesia    slow to wake up    Delayed sleep phase syndrome    Dermatographism    Dizziness    Flat feet, bilateral    Inattention    Keratosis    Major depression    Migraine without aura and without status migrainosus, not intractable    Vitamin D  deficiency    Vitiligo       Objective:     BP 129/78 (BP Location: Left Arm, Cuff Size: Normal)   Pulse 73   SpO2 98% Comment: RA  SpO2: 98 % (RA)  Amb pleasant wm nad    HEENT : Oropharynx  clear      Nasal turbinates nl    NECK :  without  apparent JVD/ palpable Nodes/TM    LUNGS: no acc muscle use,  Nl contour chest which is clear to A and P bilaterally without cough on insp or exp maneuvers   CV:  RRR  no s3 or murmur or increase in P2, and no edema   ABD:  soft and nontender   MS:    ext warm without deformities Or obvious joint restrictions  calf tenderness, cyanosis or clubbing    SKIN: warm and dry without lesions    NEURO:  alert, approp, nl sensorium with  no motor or cerebellar deficits apparent.       Assessment   Chest pain, atypical Recurrent in July 2025 from 2022 leading to EGD (Dr Stacia) 03/15/21 nl esophagus (though symptoms had resolved prior) > f/u prn - CTa  02/04/24 wnl  - 02/05/2024 rec max gerd rx to include gaviscon plus diet and off all vapes and f/u GI if not improving   The good news is that he's had similar atypical symptoms 3 y ago and nothing of importance was found but was assoc then as now with apparent anxiety disorder and sensation of hyperventilation/ panic (used rubber band to demonstration and elasticity properties of the lung as to why he feels chest tightness when he fails to relax to FRC between breaths.  I will let Dr Stacia know he's headed back to GI but hopefully the problem will resolve itself and f/u here prn          Each maintenance medication was reviewed in detail including emphasizing most importantly the difference between maintenance and prns and under what circumstances the prns  are to be triggered using an action plan format where appropriate.  Total time for H and P, chart review, counseling, and generating customized AVS unique to this office visit / same day charting =   60 min  new pt  eval with multiple  refractory recurrent chest and respiratory  symptoms of uncertain etiology  Ozell America, MD 02/05/2024

## 2024-02-05 NOTE — Assessment & Plan Note (Signed)
 Recurrent in July 2025 from 2022 leading to EGD (Dr Stacia) 03/15/21 nl esophagus (though symptoms had resolved prior) > f/u prn - CTa  02/04/24 wnl  - 02/05/2024 rec max gerd rx to include gaviscon plus diet and off all vapes and f/u GI if not improving   The good news is that he's had similar atypical symptoms 3 y ago and nothing of importance was found but was assoc then as now with apparent anxiety disorder and sensation of hyperventilation/ panic (used rubber band to demonstration and elasticity properties of the lung as to why he feels chest tightness when he fails to relax to FRC between breaths.  I will let Dr Stacia know he's headed back to GI but hopefully the problem will resolve itself and f/u here prn          Each maintenance medication was reviewed in detail including emphasizing most importantly the difference between maintenance and prns and under what circumstances the prns are to be triggered using an action plan format where appropriate.  Total time for H and P, chart review, counseling, and generating customized AVS unique to this office visit / same day charting =   60 min  new pt  eval with multiple  refractory recurrent chest and respiratory  symptoms of uncertain etiology

## 2024-02-05 NOTE — Patient Instructions (Addendum)
 Continue protonix  (pantoprazole ) 40 mg before first and last day  and pepcid  20 mg after supper   Gaviscon liquid as needed   GERD (REFLUX)  is an extremely common cause of respiratory symptoms just like yours , many times with no obvious heartburn at all.    It can be treated with medication, but also with lifestyle changes including elevation of the head of your bed (ideally with 6 -8inch blocks under the headboard of your bed),  Smoking cessation, avoidance of late meals, excessive alcohol, and avoid fatty foods, chocolate, peppermint, colas, red wine, and acidic juices such as orange juice.  NO MINT OR MENTHOL PRODUCTS SO NO COUGH DROPS - LUDENs cough drops  USE SUGARLESS CANDY INSTEAD (Jolley ranchers or [employer] or Environmental manager) or even ice chips will also do - the key is to swallow to prevent all throat clearing. NO OIL BASED VITAMINS - use powdered substitutes.  Avoid fish oil when coughing.   I will let Dr Stacia know about your visit.

## 2024-02-06 ENCOUNTER — Ambulatory Visit (HOSPITAL_COMMUNITY)
Admission: RE | Admit: 2024-02-06 | Discharge: 2024-02-06 | Disposition: A | Discharge status: Home / Self Care | Source: Ambulatory Visit | Attending: Otolaryngology | Admitting: Otolaryngology

## 2024-02-06 DIAGNOSIS — E0789 Other specified disorders of thyroid: Secondary | ICD-10-CM | POA: Diagnosis present

## 2024-02-15 ENCOUNTER — Ambulatory Visit (INDEPENDENT_AMBULATORY_CARE_PROVIDER_SITE_OTHER): Admitting: Gastroenterology

## 2024-02-15 ENCOUNTER — Encounter: Payer: Self-pay | Admitting: Gastroenterology

## 2024-02-15 ENCOUNTER — Other Ambulatory Visit (INDEPENDENT_AMBULATORY_CARE_PROVIDER_SITE_OTHER)

## 2024-02-15 VITALS — BP 110/74 | HR 66 | Ht 74.0 in | Wt 224.0 lb

## 2024-02-15 DIAGNOSIS — R1319 Other dysphagia: Secondary | ICD-10-CM | POA: Diagnosis not present

## 2024-02-15 DIAGNOSIS — R1310 Dysphagia, unspecified: Secondary | ICD-10-CM

## 2024-02-15 DIAGNOSIS — R0789 Other chest pain: Secondary | ICD-10-CM

## 2024-02-15 DIAGNOSIS — K219 Gastro-esophageal reflux disease without esophagitis: Secondary | ICD-10-CM

## 2024-02-15 DIAGNOSIS — R933 Abnormal findings on diagnostic imaging of other parts of digestive tract: Secondary | ICD-10-CM

## 2024-02-15 DIAGNOSIS — R1084 Generalized abdominal pain: Secondary | ICD-10-CM

## 2024-02-15 DIAGNOSIS — R1011 Right upper quadrant pain: Secondary | ICD-10-CM | POA: Diagnosis not present

## 2024-02-15 LAB — C-REACTIVE PROTEIN: CRP: <1 mg/dL (ref 0.5–20.0)

## 2024-02-15 NOTE — Patient Instructions (Signed)
 You have been scheduled for an endoscopy. Please follow written instructions given to you at your visit today.  If you use inhalers (even only as needed), please bring them with you on the day of your procedure.  If you take any of the following medications, they will need to be adjusted prior to your procedure:   DO NOT TAKE 7 DAYS PRIOR TO TEST- Trulicity (dulaglutide) Ozempic, Wegovy (semaglutide) Mounjaro (tirzepatide) Bydureon Bcise (exanatide extended release)  DO NOT TAKE 1 DAY PRIOR TO YOUR TEST Rybelsus (semaglutide) Adlyxin (lixisenatide) Victoza (liraglutide) Byetta (exanatide) ___________________________________________________________________________   Your provider has requested that you go to the basement level for lab work before leaving today. Press B on the elevator. The lab is located at the first door on the left as you exit the elevator.  _______________________________________________________  If your blood pressure at your visit was 140/90 or greater, please contact your primary care physician to follow up on this.  _______________________________________________________  If you are age 109 or older, your body mass index should be between 23-30. Your Body mass index is 28.76 kg/m. If this is out of the aforementioned range listed, please consider follow up with your Primary Care Provider.  If you are age 80 or younger, your body mass index should be between 19-25. Your Body mass index is 28.76 kg/m. If this is out of the aformentioned range listed, please consider follow up with your Primary Care Provider.   ________________________________________________________  The [facility] providers would like to encourage you to use MYCHART to communicate with providers for non-urgent requests or questions.  Due to long hold times on the telephone, sending your provider a message by [facility] may be a faster and more efficient way to get a response.  Please allow 48  business hours for a response.  Please remember that this is for non-urgent requests.  _______________________________________________________  [facility] is using a team-based approach to care.  Your team is made up of your doctor and two to three APPS. Our APPS (Nurse Practitioners and Physician Assistants) work with your physician to ensure care continuity for you. They are fully qualified to address your health concerns and develop a treatment plan. They communicate directly with your gastroenterologist to care for you. Seeing the Advanced Practice Practitioners on your physician's team can help you by facilitating care more promptly, often allowing for earlier appointments, access to diagnostic testing, procedures, and other specialty referrals.    It was a pleasure to see you today!  Thank you for trusting me with your gastrointestinal care!    Scott E.Stacia, MD

## 2024-02-15 NOTE — Progress Notes (Signed)
 Discussed the use of AI scribe software for clinical note transcription with the patient, who gave verbal consent to proceed.  HPI : Garrett Booker is a 23 y.o. male who is referred to us  by Chrystal Lamarr RAMAN, * for further evaluation of abdominal pain.  I originally saw the patient in Sept 2022 with symptoms of dysphagia and chest pain/tightness.  He underwent an upper endoscopy in September 2022 which showed a normal-appearing esophagus.  There were several small gastric erosions which were biopsied.  Esophageal biopsies showed mild reflux changes, but no evidence of eosinophilic esophagitis.  He was seen in the emergency department 3 times in a 2-week period and late July, early August with symptoms of chest pain and abdominal pain.  Labs, to include CBC, CMP, lipase and troponins were unremarkable. Imaging included a CT abdomen/pelvis on July 31 which showed some prominent right lower quadrant mesenteric lymph nodes, but no mesenteric stranding or haziness.  Right upper quadrant ultrasound was normal.  A CT chest was negative for evidence of pulmonary embolism or aortic dissection.  He was referred to pulmonology, who did not feel like he had any significant underlying pulmonary disease.  It was felt that his symptoms may be related to anxiety/panic attack.  He has been experiencing upper abdominal and chest pain for the past four weeks, leading to several ER visits.  The chest pain is described as a daily tightness and pressure, with occasional sharp pain, primarily on the right side, sometimes on the left. The pain is not constant and seems to be triggered by certain positions or meals, lasting a couple of hours and occurring every three days on average.  He experiences swallowing difficulties, particularly with solid foods like baked chicken, requiring a second swallow to fully ingest. Softer foods like soup do not cause this issue. This swallowing difficulty is not a daily  occurrence.  He has been taking pantoprazole  and Pepcid  for over a week but feels they have not significantly alleviated his symptoms. He denies classic heartburn symptoms but notes a tight feeling in his chest.  He reports a significant weight fluctuation, having gained and then lost 15 pounds between May and now.  He denies trying to lose or gain weight. His bowel movements are mostly normal, occurring once a day, though he describes them as sometimes flaky. No vomiting, but he mentions occasional lightheadedness.  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 He spent the summer relaxing.      Right upper quadrant ultrasound January 31 2024 IMPRESSION: Normal examination. No evidence of cholelithiasis or acute cholecystitis  CT abdomen/pelvis January 31, 2024 IMPRESSION: 1. Prominent right lower quadrant mesenteric lymph nodes can be seen in the setting of mesenteric adenitis. 2. Appendix is not definitely seen.   EGD September 2022 Indication: Chest pain/dysphagia Esophagus normal Stomach with few small antral erosions Normal duodenum  1. Surgical [P], gastric body biopsies focal gastritis - GASTRIC ANTRAL MUCOSA WITH NONSPECIFIC REACTIVE GASTROPATHY - GASTRIC OXYNTIC MUCOSA WITH NO SPECIFIC HISTOPATHOLOGIC CHANGES - WARTHIN STARRY STAIN IS NEGATIVE FOR HELICOBACTER PYLORI 2. Surgical [P], distal esophagus - ESOPHAGEAL SQUAMOUS MUCOSA WITH MILD VASCULAR CONGESTION, AND FOCAL SQUAMOUS BALLOONING, SUGGESTIVE OF REFLUX ESOPHAGITIS - NEGATIVE FOR INCREASED INTRAEPITHELIAL EOSINOPHILS 3. Surgical [P], proximal esophagus - ESOPHAGEAL SQUAMOUS MUCOSA WITH MILD VASCULAR CONGESTION, AND FOCAL SQUAMOUS BALLOONING, SUGGESTIVE OF REFLUX ESOPHAGITIS - NEGATIVE FOR INCREASED INTRAEPITHELIAL EOSINOPHILS  Past Medical History:  Diagnosis Date   Abdominal pain  ADHD    Allergic rhinitis    Anxiety    Complication of anesthesia    slow to wake up   Delayed sleep phase  syndrome    Dermatographism    Dizziness    Flat feet, bilateral    Inattention    Keratosis    Major depression    Migraine without aura and without status migrainosus, not intractable    Vitamin D  deficiency    Vitiligo      Past Surgical History:  Procedure Laterality Date   NASAL RECONSTRUCTION WITH SEPTAL REPAIR N/A 11/14/2023   Procedure: RHINOPLASTY WITH REPAIR OF VESTIBULAR STENOSIS;  Surgeon: Luciano Standing, MD;  Location: [facility];  Service: ENT;  Laterality: N/A;   NASAL SEPTOPLASTY W/ TURBINOPLASTY Bilateral 11/14/2023   Procedure: SEPTOPLASTY WITH BILATERAL INFERIOR TURBINATE REDUCTION;  Surgeon: Luciano Standing, MD;  Location:  SURGERY CENTER;  Service: ENT;  Laterality: Bilateral;   TONSILLECTOMY     Family History  Problem Relation Age of Onset   Celiac disease Neg Hx    Ulcers Neg Hx    Thyroid  disease Mother    Cancer Maternal Grandmother        breast   Thyroid  disease Maternal Grandmother    Diabetes Maternal Grandfather    Social History   Tobacco Use   Smoking status: Never    Passive exposure: Never   Smokeless tobacco: Never  Vaping Use   Vaping status: Every Day   Substances: Nicotine, Flavoring  Substance Use Topics   Alcohol use: No   Drug use: No   Current Outpatient Medications  Medication Sig Dispense Refill   diazepam  (VALIUM ) 5 MG tablet Take 1 tablet (5 mg total) by mouth 2 (two) times daily. (Patient taking differently: Take 5 mg by mouth 2 (two) times daily as needed.) 10 tablet 0   famotidine  (PEPCID ) 20 MG tablet One after supper 30 tablet 11   famotidine  (PEPCID ) 40 MG/5ML suspension Take 2.5 mLs (20 mg total) by mouth 2 (two) times daily for 7 days. 50 mL 0   fluticasone (FLONASE) 50 MCG/ACT nasal spray Place 1 spray into both nostrils daily. (Patient taking differently: Place 1 spray into both nostrils daily as needed for allergies or rhinitis.)     ibuprofen  (ADVIL ,MOTRIN ) 200 MG tablet Take 200 mg by  mouth every 6 (six) hours as needed.     lamoTRIgine (LAMICTAL) 25 MG tablet Take 50 mg by mouth daily.     ondansetron  (ZOFRAN -ODT) 4 MG disintegrating tablet Take 1 tablet (4 mg total) by mouth every 8 (eight) hours as needed. 20 tablet 0   pantoprazole  (PROTONIX ) 20 MG tablet Take 2 tablets (40 mg total) by mouth daily for 14 days. 28 tablet 0   pantoprazole  (PROTONIX ) 40 MG tablet Take 30- 60 min before your first and last meals of the day 60 tablet 2   sertraline (ZOLOFT) 100 MG tablet Take 100 mg by mouth daily.     sodium chloride  (OCEAN) 0.65 % SOLN nasal spray Place 2 sprays into both nostrils 4 (four) times daily. 30 mL 1   No current facility-administered medications for this visit.   Allergies  Allergen Reactions   Albuterol Rash     Review of Systems: All systems reviewed and negative except where noted in HPI.    US  THYROID  Result Date: 02/08/2024 CLINICAL DATA:  Thyroid  fullness EXAM: THYROID  ULTRASOUND TECHNIQUE: Ultrasound examination of the thyroid  gland and adjacent soft tissues was performed. COMPARISON:  None available. FINDINGS: Parenchymal Echotexture: Mildly heterogenous Isthmus: 0.4 cm Right lobe: 4.9 x 1.5 x 1.4 cm Left lobe: 4.1 x 1.2 x 1.4 cm _________________________________________________________ Estimated total number of nodules >/= 1 cm: 0 Number of spongiform nodules >/=  2 cm not described below (TR1): 0 Number of mixed cystic and solid nodules >/= 1.5 cm not described below (TR2): 0 _________________________________________________________ No discrete nodules are seen within the thyroid gland. IMPRESSION: Mild diffuse heterogeneity of the thyroid without discrete nodule. The above is in keeping with the ACR TI-RADS recommendations - J Am Coll Radiol 2017;14:587-595. Electronically Signed   By: Aliene Lloyd M.D.   On: 02/08/2024 09:26   CT Angio Chest PE W and/or Wo Contrast Result Date: 02/04/2024 CLINICAL DATA:  Pulmonary embolism (PE) suspected, high prob.  Chest tightness. EXAM: CT ANGIOGRAPHY CHEST WITH CONTRAST TECHNIQUE: Multidetector CT imaging of the chest was performed using the standard protocol during bolus administration of intravenous contrast. Multiplanar CT image reconstructions and MIPs were obtained to evaluate the vascular anatomy. RADIATION DOSE REDUCTION: This exam was performed according to the departmental dose-optimization program which includes automated exposure control, adjustment of the mA and/or kV according to patient size and/or use of iterative reconstruction technique. CONTRAST:  75mL OMNIPAQUE IOHEXOL 350 MG/ML SOLN COMPARISON:  Chest x-ray today. FINDINGS: Cardiovascular: Heart is normal size. Aorta is normal caliber. No filling defects in the pulmonary arteries to suggest pulmonary emboli. Mediastinum/Nodes: No mediastinal, hilar, or axillary adenopathy. Trachea and esophagus are unremarkable. Thyroid unremarkable. Lungs/Pleura: Lungs are clear. No focal airspace opacities or suspicious nodules. No effusions. Upper Abdomen: No acute findings Musculoskeletal: Chest wall soft tissues are unremarkable. No acute bony abnormality. Review of the MIP images confirms the above findings. IMPRESSION: No evidence of pulmonary embolus. No acute cardiopulmonary disease. Electronically Signed   By: Franky Crease M.D.   On: 02/04/2024 12:29   DG Chest 2 View Result Date: 02/04/2024 EXAM: 2 VIEW(S) XRAY OF THE CHEST 02/04/2024 10:38:00 AM COMPARISON: 01/31/2024 CLINICAL HISTORY: Chest Pain. Pt having chest pain, trouble breathing, SOB.; Started about 3 weeks ago, gotten worst past few days. FINDINGS: LUNGS AND PLEURA: No focal pulmonary opacity. No pulmonary edema. No pleural effusion. No pneumothorax. HEART AND MEDIASTINUM: No acute abnormality of the cardiac and mediastinal silhouettes. BONES AND SOFT TISSUES: No acute osseous abnormality. LINES AND TUBES: Leads and wires project over the chest on the frontal view. IMPRESSION: 1. No acute process.  Electronically signed by: Rockey Kilts MD 02/04/2024 10:50 AM EDT RP Workstation: [id number]   US  Abdomen Limited RUQ (LIVER/GB) Result Date: 01/31/2024 CLINICAL DATA:  Right upper quadrant and epigastric pain. EXAM: ULTRASOUND ABDOMEN LIMITED RIGHT UPPER QUADRANT COMPARISON:  CT abdomen and pelvis 01/31/2024 FINDINGS: Gallbladder: No gallstones or wall thickening visualized. No sonographic Murphy sign noted by sonographer. Common bile duct: Diameter: 2 mm, normal Liver: No focal lesion identified. Within normal limits in parenchymal echogenicity. Portal vein is patent on color Doppler imaging with normal direction of blood flow towards the liver. Other: None. IMPRESSION: Normal examination. No evidence of cholelithiasis or acute cholecystitis. Electronically Signed   By: [name] Gravely M.D.   On: 01/31/2024 19:30   CT ABDOMEN PELVIS W CONTRAST Result Date: 01/31/2024 CLINICAL DATA:  Right lower quadrant pain. EXAM: CT ABDOMEN AND PELVIS WITH CONTRAST TECHNIQUE: Multidetector CT imaging of the abdomen and pelvis was performed using the standard protocol following bolus administration of intravenous contrast. RADIATION DOSE REDUCTION: This exam was performed according to the departmental dose-optimization program which  includes automated exposure control, adjustment of the mA and/or kV according to patient size and/or use of iterative reconstruction technique. CONTRAST:  OMNIPAQUE  IOHEXOL  300 MG/ML  SOLN COMPARISON:  None Available. FINDINGS: Lower chest: No acute abnormality. Hepatobiliary: No focal liver abnormality is seen. No gallstones, gallbladder wall thickening, or biliary dilatation. Pancreas: Unremarkable. No pancreatic ductal dilatation or surrounding inflammatory changes. Spleen: Normal in size without focal abnormality. Adrenals/Urinary Tract: Adrenal glands are unremarkable. Kidneys are normal, without renal calculi, focal lesion, or hydronephrosis. Bladder is unremarkable. Stomach/Bowel:  Stomach is within normal limits. Appendix is not definitely seen. No evidence of bowel wall thickening, distention, or inflammatory changes. Vascular/Lymphatic: There are prominent right lower quadrant mesenteric lymph nodes. Aorta and IVC are normal in size. Reproductive: Prostate is unremarkable. Other: No abdominal wall hernia or abnormality. No abdominopelvic ascites. Musculoskeletal: No fracture is seen. IMPRESSION: 1. Prominent right lower quadrant mesenteric lymph nodes can be seen in the setting of mesenteric adenitis. 2. Appendix is not definitely seen. Electronically Signed   By: Greig Pique M.D.   On: 01/31/2024 17:40   DG Chest 2 View Result Date: 01/31/2024 CLINICAL DATA:  Chest pain. EXAM: CHEST - 2 VIEW COMPARISON:  Chest CT dated 01/22/2024. FINDINGS: The heart size and mediastinal contours are within normal limits. Both lungs are clear. The visualized skeletal structures are unremarkable. IMPRESSION: No active cardiopulmonary disease. Electronically Signed   By: Vanetta Chou M.D.   On: 01/31/2024 14:03   DG Chest 2 View Result Date: 01/22/2024 CLINICAL DATA:  Chest pain. EXAM: CHEST - 2 VIEW COMPARISON:  03/02/2021. FINDINGS: The heart size and mediastinal contours are within normal limits. Both lungs are clear. No pleural effusion or pneumothorax. No acute osseous abnormality. IMPRESSION: No acute cardiopulmonary findings. Electronically Signed   By: Harrietta Sherry M.D.   On: 01/22/2024 13:10    Physical Exam: BP 110/74 (BP Location: Left Arm, Patient Position: Sitting, Cuff Size: Normal)   Pulse 66   Ht 6' 2 (1.88 m)   Wt 224 lb (101.6 kg)   BMI 28.76 kg/m  Constitutional: Pleasant,well-developed, Caucasian male in no acute distress. HEENT: Normocephalic and atraumatic. Conjunctivae are normal. No scleral icterus. Neck supple.  Cardiovascular: Normal rate, regular rhythm.  Pulmonary/chest: Effort normal and breath sounds normal. No wheezing, rales or  rhonchi. Abdominal: Soft, nondistended, mildly tender to palpation throughout the entire abdomen without any focal tenderness.  No rigidity or guarding.. Bowel sounds active throughout. There are no masses palpable. No hepatomegaly. Extremities: no edema Lymphadenopathy: No cervical adenopathy noted. Neurological: Alert and oriented to person place and time. Skin: Skin is warm and dry. No rashes noted. Psychiatric: Normal mood and affect. Behavior is normal.  CBC    Component Value Date/Time   WBC 4.9 02/04/2024 1100   RBC 5.34 02/04/2024 1100   HGB 15.6 02/04/2024 1100   HGB 15.1 05/13/2021 1632   HCT 45.6 02/04/2024 1100   HCT 46.4 05/13/2021 1632   PLT 285 02/04/2024 1100   PLT 274 05/13/2021 1632   MCV 85.4 02/04/2024 1100   MCV 90 05/13/2021 1632   MCH 29.2 02/04/2024 1100   MCHC 34.2 02/04/2024 1100   RDW 11.6 02/04/2024 1100   RDW 12.8 05/13/2021 1632   LYMPHSABS 1.3 02/04/2024 1100   MONOABS 0.4 02/04/2024 1100   EOSABS 0.1 02/04/2024 1100   BASOSABS 0.0 02/04/2024 1100    CMP     Component Value Date/Time   NA 138 02/04/2024 1100   NA 140 05/13/2021  1632   K 3.7 02/04/2024 1100   CL 102 02/04/2024 1100   CO2 21 (L) 02/04/2024 1100   GLUCOSE 102 (H) 02/04/2024 1100   BUN 14 02/04/2024 1100   BUN 17 05/13/2021 1632   CREATININE 0.92 02/04/2024 1100   CALCIUM 10.2 02/04/2024 1100   PROT 8.2 (H) 02/04/2024 1100   PROT 7.3 05/13/2021 1632   ALBUMIN 5.1 (H) 02/04/2024 1100   ALBUMIN 5.2 05/13/2021 1632   AST 21 02/04/2024 1100   ALT 19 02/04/2024 1100   ALKPHOS 81 02/04/2024 1100   BILITOT 1.0 02/04/2024 1100   BILITOT <0.2 05/13/2021 1632   GFRNONAA >60 02/04/2024 1100       Latest Ref Rng & Units 02/04/2024   11:00 AM 01/31/2024    1:05 PM 01/22/2024   12:45 PM  CBC EXTENDED  WBC 4.0 - 10.5 K/uL 4.9  5.6  4.5   RBC 4.22 - 5.81 MIL/uL 5.34  5.30  4.99   Hemoglobin 13.0 - 17.0 g/dL 84.3  84.3  85.0   HCT 39.0 - 52.0 % 45.6  45.1  42.6   Platelets 150 -  400 K/uL 285  292  261   NEUT# 1.7 - 7.7 K/uL 3.1  3.2    Lymph# 0.7 - 4.0 K/uL 1.3  1.5        ASSESSMENT AND PLAN:  23 year old male with history of anxiety and ADHD, with 4 weeks of dysphagia, chest pain and abdominal pain.  CT scan with some prominent lymph nodes in the right lower quadrant, otherwise unremarkable evaluation to include ultrasound and blood tests. Also having chest tightness and trouble breathing, with no suspected underlying pulmonary disease per recent pulmonary evaluation. Suspect patient's symptoms are related to stress/anxiety, but offered repeat upper endoscopy to reassess for peptic stricture or subsequent development of EOE.  Patient would like to proceed with upper endoscopy.  Dysphagia and chest tightness under evaluation Intermittent dysphagia with solids and chest tightness for four weeks. Previous endoscopy essentially unremarkable but with histologic reflux. Differential includes esophageal narrowing, eosinophilic esophagitis or motility disorder. Symptoms may be stress-related. - Schedule EGD to evaluate esophagus for narrowing or abnormalities. - Consider esophageal manometry if EGD is normal and symptoms persist.  Upper abdominal pain and mesenteric lymphadenopathy Intermittent right upper abdominal pain with enlarged mesenteric lymph nodes on CT, possibly mesenteric adenitis.  - Repeat CT scan in six months to assess mesenteric lymphadenopathy. - Order CRP to evaluate for inflammation.    Gastroesophageal reflux disease (GERD) GERD with chest tightness and pressure.  No typical symptoms of heartburn or acid regurgitation.  Minimal relief from pantoprazole  and Pepcid . Previous endoscopy showed histologic reflux but no overt endoscopic reflux esophagitis. - Continue pantoprazole  and Pepcid . - Evaluate for esophageal narrowing or changes during upcoming EGD.  Anxiety contributing to gastrointestinal symptoms Anxiety may contribute to dysphagia and chest  tightness. Stress and anxiety can manifest as GI symptoms. - Discuss anxiety's role in symptoms and consider addressing if symptoms persist.  Recording duration: 15 minutes     Deckard Stuber E. Stacia, MD [facility]     Chrystal Lamarr RAMAN, *

## 2024-02-18 ENCOUNTER — Ambulatory Visit: Payer: Self-pay | Admitting: Gastroenterology

## 2024-02-18 NOTE — Progress Notes (Signed)
 [name], Your CRP (marker of inflammation) was normal.  Let's plan to repeat the CT scan in 6 months as planned.   See you next month for your upper endoscopy.

## 2024-03-14 ENCOUNTER — Encounter: Payer: Self-pay | Admitting: Gastroenterology

## 2024-03-14 ENCOUNTER — Ambulatory Visit (AMBULATORY_SURGERY_CENTER): Admitting: Gastroenterology

## 2024-03-14 VITALS — BP 112/75 | HR 76 | Temp 98.3°F | Resp 16 | Ht 74.0 in | Wt 224.0 lb

## 2024-03-14 DIAGNOSIS — K2100 Gastro-esophageal reflux disease with esophagitis, without bleeding: Secondary | ICD-10-CM

## 2024-03-14 DIAGNOSIS — R1084 Generalized abdominal pain: Secondary | ICD-10-CM

## 2024-03-14 DIAGNOSIS — R0789 Other chest pain: Secondary | ICD-10-CM

## 2024-03-14 DIAGNOSIS — K269 Duodenal ulcer, unspecified as acute or chronic, without hemorrhage or perforation: Secondary | ICD-10-CM | POA: Diagnosis not present

## 2024-03-14 DIAGNOSIS — K3189 Other diseases of stomach and duodenum: Secondary | ICD-10-CM

## 2024-03-14 DIAGNOSIS — K319 Disease of stomach and duodenum, unspecified: Secondary | ICD-10-CM | POA: Diagnosis not present

## 2024-03-14 DIAGNOSIS — R1319 Other dysphagia: Secondary | ICD-10-CM

## 2024-03-14 DIAGNOSIS — R933 Abnormal findings on diagnostic imaging of other parts of digestive tract: Secondary | ICD-10-CM

## 2024-03-14 MED ORDER — SODIUM CHLORIDE 0.9 % IV SOLN: 500.0000 mL | Freq: Once | INTRAVENOUS | Status: DC

## 2024-03-14 MED ORDER — OMEPRAZOLE 20 MG PO CPDR: 20.0000 mg | DELAYED_RELEASE_CAPSULE | Freq: Two times a day (BID) | ORAL | 0 refills | Status: DC

## 2024-03-14 NOTE — Patient Instructions (Signed)

## 2024-03-14 NOTE — Progress Notes (Signed)
 History and Physical Interval Note:  03/14/2024 2:30 PM  Garrett Booker  has presented today for endoscopic procedure(s), with the diagnosis of  Encounter Diagnoses  Name Primary?   Esophageal dysphagia Yes   Imaging of gastrointestinal tract abnormal    Generalized abdominal pain   .  The various methods of evaluation and treatment have been discussed with the patient and/or family. After consideration of risks, benefits and other options for treatment, the patient has consented to  the endoscopic procedure(s).   The patient's history has been reviewed, patient examined, no change in status, stable for endoscopic procedure(s).  I have reviewed the patient's chart and labs.  Questions were answered to the patient's satisfaction.     Danni Shima E. Stacia, MD [facility]

## 2024-03-14 NOTE — Progress Notes (Signed)
 Pt's states no medical or surgical changes since previsit or office visit.

## 2024-03-14 NOTE — Progress Notes (Signed)
 Sedate, gd SR, tolerated procedure well, VSS, report to RN

## 2024-03-14 NOTE — Op Note (Signed)
 [facility] Patient Name: Garrett Booker Procedure Date: 03/14/2024 2:31 PM MRN: [id number] Endoscopist: Glendia E. Stacia , MD, [id number] Age: 23 Referring MD:  Date of Birth: Apr 11, 2001 Gender: Male Account #: [id number] Procedure:                Upper GI endoscopy Indications:              Chest pain (non cardiac)/tightness; currently                            taking pepcid  daily Medicines:                Monitored Anesthesia Care Procedure:                Pre-Anesthesia Assessment:                           - Prior to the procedure, a History and Physical                            was performed, and patient medications and                            allergies were reviewed. The patient's tolerance of                            previous anesthesia was also reviewed. The risks                            and benefits of the procedure and the sedation                            options and risks were discussed with the patient.                            All questions were answered, and informed consent                            was obtained. Prior Anticoagulants: The patient has                            taken no anticoagulant or antiplatelet agents. ASA                            Grade Assessment: I - A normal, healthy patient.                            After reviewing the risks and benefits, the patient                            was deemed in satisfactory condition to undergo the                            procedure.  After obtaining informed consent, the endoscope was                            passed under direct vision. Throughout the                            procedure, the patient's blood pressure, pulse, and                            oxygen  saturations were monitored continuously. The                            GIF [id number] #[id number] was introduced through the                            mouth, and advanced to the second part of  duodenum.                            The upper GI endoscopy was accomplished without                            difficulty. The patient tolerated the procedure                            well. Scope In: Scope Out: Findings:                 LA Grade A (one or more mucosal breaks less than 5                            mm, not extending between tops of 2 mucosal folds)                            esophagitis was found at the gastroesophageal                            junction.                           The exam of the esophagus was otherwise normal.                           Biopsies were obtained from the proximal and distal                            esophagus with cold forceps for histology of                            suspected eosinophilic esophagitis. Estimated blood                            loss was minimal.                           A few localized diminutive erosions with no  bleeding and no stigmata of recent bleeding were                            found in the prepyloric region of the stomach.                            Biopsies were taken with a cold forceps for                            Helicobacter pylori testing. Estimated blood loss                            was minimal.                           The exam of the stomach was otherwise normal.                           A few erosions were found in the duodenal bulb.                           The exam of the duodenum was otherwise normal. Complications:            No immediate complications. Estimated Blood Loss:     Estimated blood loss was minimal. Impression:               - LA Grade A reflux esophagitis.                           - Erosive gastropathy with no bleeding and no                            stigmata of recent bleeding. Biopsied.                           - Duodenal erosions.                           - Biopsies were taken with a cold forceps for                             evaluation of eosinophilic esophagitis. Recommendation:           - Patient has a contact number available for                            emergencies. The signs and symptoms of potential                            delayed complications were discussed with the                            patient. Return to normal activities tomorrow.                            Written  discharge instructions were provided to the                            patient.                           - Resume previous diet.                           - Continue present medications.                           - Use Prilosec (omeprazole ) 20 mg PO twice daily                            for 4 weeks #60 rf0 to see if this improves                            symtoms. Can continue Pepcid  as needed as well.                           - Avoid NSAIDs. Navina Wohlers E. Stacia, MD 03/14/2024 2:56:27 PM This report has been signed electronically.

## 2024-03-14 NOTE — Progress Notes (Signed)
 Called to room to assist during endoscopic procedure.  Patient ID and intended procedure confirmed with present staff. Received instructions for my participation in the procedure from the performing physician.

## 2024-03-15 ENCOUNTER — Telehealth: Payer: Self-pay | Admitting: Physician Assistant

## 2024-03-15 NOTE — Telephone Encounter (Signed)
 Patient called this afternoon complaining of pain in the center of his chest since he woke up this morning.  He describes it as a sharp pain which is constant but worsened by drinking liquids.  He does not have any shortness of breath or pain with inspiration, no abdominal pain.  No fever or chills.  EGD yesterday with Dr. Stacia with biopsies of the esophagus due to concern for possible eosinophilic esophagitis, and also had grade a esophagitis and a few erosions in the prepyloric area.  No dilation was done.  Patient rates his pain as a 7 out of 10 after attempts of liquids.   Advised him to be evaluated in the emergency room,, patient agreeable with this plan.

## 2024-03-17 ENCOUNTER — Telehealth: Payer: Self-pay

## 2024-03-17 DIAGNOSIS — R0789 Other chest pain: Secondary | ICD-10-CM

## 2024-03-17 DIAGNOSIS — R1084 Generalized abdominal pain: Secondary | ICD-10-CM

## 2024-03-17 MED ORDER — LIDOCAINE VISCOUS HCL 2 % MT SOLN: 10.0000 mL | OROMUCOSAL | 0 refills | Status: AC | PRN

## 2024-03-17 NOTE — Telephone Encounter (Signed)
 Called patient back and spoke with him about his symptoms following his EGD last Friday. He was waiting on phone call this morning and di not receive one so his mother called in. He is still experiencing chest pain especially after drinking. It looks like this was happening before his procedure but went from a dull pain to sometimes sharp now. I did explain about the biopsies that they took and what they are looking for but and also verified that he is taking the medication that was prescribed and he is. He says this feeling also interferes with his breathing ast tim,es which also was going on before. Please advise

## 2024-03-17 NOTE — Telephone Encounter (Signed)
 Inbound call from pt mother stating that her son recently had an EGD and since then he has been having pain in his chest and some breathing issues. Pt mother stated that she would like for the nurse to please return her call. Please advise.

## 2024-03-17 NOTE — Telephone Encounter (Signed)
 Reached back out to patient and let him to know that he can take tylenol  for his pain and can also try viscous lidocaine . I explained that we will know more after the biopsies come back in a few days and reassured him that nothing serious was going on per MD. He was relieved with this information and said he would try the new medication. Advised him to call back if the pain was worse or if new symptoms developed, patient agreed.

## 2024-03-17 NOTE — Telephone Encounter (Signed)
 Left message

## 2024-03-19 LAB — SURGICAL PATHOLOGY

## 2024-03-23 ENCOUNTER — Ambulatory Visit: Payer: Self-pay | Admitting: Gastroenterology

## 2024-03-23 NOTE — Progress Notes (Signed)
 Garrett Booker,  The biopsies taken from your stomach were notable for mild reactive gastropathy which is a common finding and often related to use of certain medications (usually NSAIDs), but there was no evidence of Helicobacter pylori infection. This common finding is not felt to necessarily be a cause of any particular symptom and there is no specific treatment or further evaluation recommended.  The biopsies of your esophagus were normal.  There was no evidence of eosinophilic esophagitis, nor was there any evidence of inflammation from acid reflux.  It is very possible your chest pain symptoms may be related to underlying anxiety.  Please follow up in our office as needed for ongoing management management and work up of your symptoms.

## 2024-03-26 ENCOUNTER — Ambulatory Visit: Admitting: Physician Assistant

## 2024-04-24 ENCOUNTER — Other Ambulatory Visit: Payer: Self-pay | Admitting: Gastroenterology

## 2024-04-24 DIAGNOSIS — R933 Abnormal findings on diagnostic imaging of other parts of digestive tract: Secondary | ICD-10-CM

## 2024-04-24 DIAGNOSIS — R1084 Generalized abdominal pain: Secondary | ICD-10-CM

## 2024-04-24 DIAGNOSIS — R1319 Other dysphagia: Secondary | ICD-10-CM

## 2024-05-21 ENCOUNTER — Encounter: Payer: Self-pay | Admitting: Gastroenterology

## 2024-07-06 ENCOUNTER — Other Ambulatory Visit: Payer: Self-pay

## 2024-07-06 ENCOUNTER — Emergency Department (HOSPITAL_COMMUNITY)

## 2024-07-06 ENCOUNTER — Encounter (HOSPITAL_COMMUNITY): Payer: Self-pay | Admitting: Pharmacy Technician

## 2024-07-06 ENCOUNTER — Emergency Department (HOSPITAL_COMMUNITY)
Admission: EM | Admit: 2024-07-06 | Discharge: 2024-07-06 | Disposition: A | Discharge status: Home / Self Care | Attending: Emergency Medicine | Admitting: Emergency Medicine

## 2024-07-06 DIAGNOSIS — R45851 Suicidal ideations: Secondary | ICD-10-CM | POA: Insufficient documentation

## 2024-07-06 DIAGNOSIS — F32A Depression, unspecified: Secondary | ICD-10-CM | POA: Diagnosis present

## 2024-07-06 DIAGNOSIS — F29 Unspecified psychosis not due to a substance or known physiological condition: Secondary | ICD-10-CM | POA: Diagnosis not present

## 2024-07-06 DIAGNOSIS — Z79899 Other long term (current) drug therapy: Secondary | ICD-10-CM | POA: Diagnosis not present

## 2024-07-06 DIAGNOSIS — R079 Chest pain, unspecified: Secondary | ICD-10-CM | POA: Insufficient documentation

## 2024-07-06 DIAGNOSIS — R1013 Epigastric pain: Secondary | ICD-10-CM | POA: Diagnosis not present

## 2024-07-06 DIAGNOSIS — F419 Anxiety disorder, unspecified: Secondary | ICD-10-CM | POA: Insufficient documentation

## 2024-07-06 LAB — CBC
HCT: 42.8 % (ref 39.0–52.0)
Hemoglobin: 14.8 g/dL (ref 13.0–17.0)
MCH: 29.5 pg (ref 26.0–34.0)
MCHC: 34.6 g/dL (ref 30.0–36.0)
MCV: 85.3 fL (ref 80.0–100.0)
Platelets: 264 K/uL (ref 150–400)
RBC: 5.02 MIL/uL (ref 4.22–5.81)
RDW: 11.4 % — ABNORMAL LOW (ref 11.5–15.5)
WBC: 5 K/uL (ref 4.0–10.5)
nRBC: 0 % (ref 0.0–0.2)

## 2024-07-06 LAB — ETHANOL: Alcohol, Ethyl (B): <15 mg/dL

## 2024-07-06 LAB — COMPREHENSIVE METABOLIC PANEL WITH GFR
ALT: 23 U/L (ref 0–44)
AST: 21 U/L (ref 15–41)
Albumin: 5 g/dL (ref 3.5–5.0)
Alkaline Phosphatase: 75 U/L (ref 38–126)
Anion gap: 15 (ref 5–15)
BUN: 13 mg/dL (ref 6–20)
CO2: 24 mmol/L (ref 22–32)
Calcium: 10.3 mg/dL (ref 8.9–10.3)
Chloride: 101 mmol/L (ref 98–111)
Creatinine, Ser: 0.82 mg/dL (ref 0.61–1.24)
GFR, Estimated: >60 mL/min
Glucose, Bld: 105 mg/dL — ABNORMAL HIGH (ref 70–99)
Potassium: 3.6 mmol/L (ref 3.5–5.1)
Sodium: 140 mmol/L (ref 135–145)
Total Bilirubin: 0.4 mg/dL (ref 0.0–1.2)
Total Protein: 7.9 g/dL (ref 6.5–8.1)

## 2024-07-06 LAB — TROPONIN T, HIGH SENSITIVITY: Troponin T High Sensitivity: <15 ng/L (ref 0–19)

## 2024-07-06 LAB — URINE DRUG SCREEN
Amphetamines: NEGATIVE
Barbiturates: NEGATIVE
Benzodiazepines: NEGATIVE
Cocaine: NEGATIVE
Fentanyl: NEGATIVE
Methadone Scn, Ur: NEGATIVE
Opiates: NEGATIVE
Tetrahydrocannabinol: POSITIVE — AB

## 2024-07-06 LAB — LIPASE, BLOOD: Lipase: 24 U/L (ref 11–51)

## 2024-07-06 MED ORDER — FAMOTIDINE 20 MG PO TABS
20.0000 mg | ORAL_TABLET | Freq: Once | ORAL | Status: AC
  Administered 2024-07-06: 20 mg via ORAL
  Filled 2024-07-06: qty 1

## 2024-07-06 MED ORDER — ALUM & MAG HYDROXIDE-SIMETH 200-200-20 MG/5ML PO SUSP
30.0000 mL | Freq: Once | ORAL | Status: AC
  Administered 2024-07-06: 30 mL via ORAL
  Filled 2024-07-06: qty 30

## 2024-07-06 MED ORDER — PANTOPRAZOLE SODIUM 40 MG PO TBEC: 40.0000 mg | DELAYED_RELEASE_TABLET | Freq: Two times a day (BID) | ORAL | Status: DC

## 2024-07-06 MED ORDER — ALUM & MAG HYDROXIDE-SIMETH 200-200-20 MG/5ML PO SUSP: 30.0000 mL | Freq: Four times a day (QID) | ORAL | Status: DC | PRN

## 2024-07-06 NOTE — ED Provider Notes (Signed)
 " Garrett Booker Provider Note   CSN: [id number] Arrival date & time: 07/06/24  1341     Patient presents with: Chest Pain and Depression   Garrett Booker is a 24 y.o. male.   Patient is a 24 year old male with a past medical history of depression, anxiety and GERD presenting to the emergency department with chest pain and SI.  Patient states for the last 3 days he has had midsternal chest and epigastric pain.  He states that it feels like an intense pressure type of pain and occasional sharp pain.  He endorses associated shortness of breath.  He denies any fever or cough, nausea or vomiting.  He states he has had similar pain in the past and has had peptic ulcers and gastritis in the past.  He states he does take PPI twice daily.  He states that he stopped using marijuana and nicotine 3 days ago and states that he feels like the pain may be due to withdrawing from these.  He denies any history of VTE, no recent hospitalization or surgery, no recent long travel car plane, hormone use or cancer history.  He also reports that he has had increasing suicidal ideation, denies any plan.  Denies any HI or hallucinations.  The history is provided by the patient and a parent.  Chest Pain Depression Associated symptoms include chest pain.       Prior to Admission medications  Medication Sig Start Date End Date Taking? Authorizing Provider  cetirizine (ZYRTEC ALLERGY) 10 MG tablet Take 10 mg by mouth as needed. 08/26/15   [provider]  Cholecalciferol 125 MCG (5000 UT) capsule Take 5,000 Units by mouth daily.    [provider]  diazepam  (VALIUM ) 5 MG tablet Take 1 tablet (5 mg total) by mouth 2 (two) times daily. 02/04/24   Elnor Jayson LABOR, DO  famotidine  (PEPCID ) 20 MG tablet One after supper 02/05/24   Wert, Michael B, MD  fluticasone [facility]) 50 MCG/ACT nasal spray Place 1 spray into both nostrils daily.    [provider]   ibuprofen  (ADVIL ,MOTRIN ) 200 MG tablet Take 200 mg by mouth every 6 (six) hours as needed.    [provider]  lamoTRIgine (LAMICTAL) 25 MG tablet Take 50 mg by mouth daily. 03/02/22   [provider]  lidocaine  (XYLOCAINE ) 2 % solution Use as directed 10 mLs in the mouth or throat every 4 (four) hours as needed for mouth pain. 03/17/24   Stacia Glendia BRAVO, MD  omeprazole  (PRILOSEC) 20 MG capsule Contact our office to let us  know if this helps your symptoms 04/24/24   Stacia Glendia BRAVO, MD  pantoprazole  (PROTONIX ) 40 MG tablet Take 30- 60 min before your first and last meals of the day 02/05/24   Darlean Ozell NOVAK, MD  sertraline (ZOLOFT) 100 MG tablet Take 100 mg by mouth daily. 01/30/21   [provider]  sodium chloride  (OCEAN) 0.65 % SOLN nasal spray Place 2 sprays into both nostrils 4 (four) times daily. 11/14/23 02/15/24  Luciano Standing, MD    Allergies: Albuterol    Review of Systems  Cardiovascular:  Positive for chest pain.  Psychiatric/Behavioral:  Positive for depression.     Updated Vital Signs BP (!) 161/85 (BP Location: Right Arm)   Pulse 68   Temp 98 F (36.7 C) (Oral)   Resp (!) 22   Ht 6' 2 (1.88 m)   Wt 103.9 kg   SpO2 96%  BMI 29.40 kg/m   Physical Exam Vitals and nursing note reviewed.  Constitutional:      General: He is not in acute distress.    Appearance: He is well-developed.  HENT:     Head: Normocephalic.  Eyes:     Extraocular Movements: Extraocular movements intact.  Cardiovascular:     Rate and Rhythm: Normal rate and regular rhythm.     Heart sounds: Normal heart sounds.  Pulmonary:     Effort: Pulmonary effort is normal.     Breath sounds: Normal breath sounds.  Chest:     Chest wall: No tenderness.  Abdominal:     Palpations: Abdomen is soft.     Tenderness: There is no abdominal tenderness.  Musculoskeletal:        General: Normal range of motion.     Cervical back: Normal range of motion and neck supple.      Right lower leg: No edema.     Left lower leg: No edema.  Skin:    General: Skin is warm and dry.  Neurological:     General: No focal deficit present.     Mental Status: He is alert and oriented to person, place, and time.  Psychiatric:        Mood and Affect: Mood normal.        Behavior: Behavior normal.     (all labs ordered are listed, but only abnormal results are displayed) Labs Reviewed  CBC - Abnormal; Notable for the following components:      Result Value   RDW 11.4 (*)    All other components within normal limits  COMPREHENSIVE METABOLIC PANEL WITH GFR - Abnormal; Notable for the following components:   Glucose, Bld 105 (*)    All other components within normal limits  URINE DRUG SCREEN - Abnormal; Notable for the following components:   Tetrahydrocannabinol POSITIVE (*)    All other components within normal limits  ETHANOL  LIPASE, BLOOD  TROPONIN T, HIGH SENSITIVITY    EKG: EKG Interpretation Date/Time:  Sunday July 06 2024 17:22:04 EST Ventricular Rate:  62 PR Interval:  142 QRS Duration:  102 QT Interval:  406 QTC Calculation: 412 R Axis:   57  Text Interpretation: Normal sinus rhythm Normal ECG No significant change since last tracing Confirmed by Ellouise Fine ([id number]) on 07/06/2024 6:28:49 PM  Radiology: DG Chest 2 View Result Date: 07/06/2024 CLINICAL DATA:  Chest pain EXAM: CHEST - 2 VIEW COMPARISON:  February 04, 2024 FINDINGS: The heart size and mediastinal contours are within normal limits. Both lungs are clear. The visualized skeletal structures are unremarkable. IMPRESSION: No active cardiopulmonary disease. Electronically Signed   By: Lynwood Landy Raddle M.D.   On: 07/06/2024 14:42     Procedures   Medications Ordered in the ED  alum & mag hydroxide-simeth (MAALOX/MYLANTA) 200-200-20 MG/5ML suspension 30 mL (has no administration in time range)  pantoprazole  (PROTONIX ) EC tablet 40 mg (has no administration in time range)  alum & mag  hydroxide-simeth (MAALOX/MYLANTA) 200-200-20 MG/5ML suspension 30 mL (30 mLs Oral Given 07/06/24 1734)  famotidine  (PEPCID ) tablet 20 mg (20 mg Oral Given 07/06/24 1734)    Clinical Course as of 07/06/24 2123  Sun Jul 06, 2024  1830 EKG and remainder of labs within normal range. He is medically cleared for psych eval. [VK]  2123 Kathryne Show, NP recommends discharge and pt is to follow up with outpatient services [VK]    Clinical Course User Index [VK] Ellouise Fine  K, DO                                 Medical Decision Making This patient presents to the ED with chief complaint(s) of chest pain, SI with pertinent past medical history of depression, anxiety, GERD which further complicates the presenting complaint. The complaint involves an extensive differential diagnosis and also carries with it a high risk of complications and morbidity.    The differential diagnosis includes ACS, arrhythmia, anemia, pneumonia, pneumothorax, pulmonary edema, pleural effusion, gastritis, GERD, pancreatitis, hepatitis, he is PERC negative making PE unlikely, SI  Additional history obtained: Additional history obtained from family Records reviewed Care Everywhere/External Records  ED Course and Reassessment: On patient's arrival he is hemodynamically stable in no acute distress.  He had labs and chest x-ray initiated in triage.  Patient has a negative troponin and chest pain has been ongoing for several days so single troponin is sufficient.  LFTs are within normal range, will add on lipase.  Chest x-ray is without acute disease.  He is pending an EKG at this time.  Will be given GI cocktail.  The patient would like to be voluntary for psychiatry evaluation.  Independent labs interpretation:  The following labs were independently interpreted: within normal range  Independent visualization of imaging: - I independently visualized the following imaging with scope of interpretation limited to determining  acute life threatening conditions related to emergency care: CXR, which revealed no acute disease  Consultation: - Consulted or discussed management/test interpretation w/ external professional: TTS   Amount and/or Complexity of Data Reviewed Labs: ordered. Radiology: ordered.  Risk OTC drugs. Prescription drug management.       Final diagnoses:  Suicidal ideation  Nonspecific chest pain    ED Discharge Orders     None          Kingsley, Adeeb Konecny K, DO 07/06/24 1831  "

## 2024-07-06 NOTE — Discharge Instructions (Addendum)
 You were seen in the emergency department for your chest pain and your suicidal ideation.  Your workup showed no signs of heart attack or stress on your heart or abnormalities with your lung.  It is possible that this could be related to your acid reflux and should make sure that you are continuing to take your antacid daily as prescribed.  You are seen by psychiatry and are contracting for safety for an outpatient follow-up for further treatment of your depression and suicidal thoughts.  You should return to the emergency department if you are having significantly worsening chest pain, severe shortness of breath, worsening suicidal ideation or have a plan to hurt yourself or if you have any other new or concerning symptoms.

## 2024-07-06 NOTE — ED Notes (Signed)
 EKG was not completed while patient was in triage before coming back to [facility]. Patient was in triage for almost 3 hours prior to coming back to this bed.

## 2024-07-06 NOTE — BH Assessment (Signed)
 Comprehensive Clinical Assessment (CCA) Note  07/06/2024 Garrett Booker [id number]   Disposition: Per Kathryne Show, NP, patient is recommended for discharge with outpatient follow-up.    The patient demonstrates the following risk factors for suicide: Chronic risk factors for suicide include: demographic factors (male, >23 y/o). Acute risk factors for suicide include: unemployment and social withdrawal/isolation. Protective factors for this patient include: positive social support. Considering these factors, the overall suicide risk at this point appears to be moderate. Patient is appropriate for outpatient follow up.   Patient is a 24 year old male with a history of anxiety disorder and depression who presents voluntarily to [facility] ED for an assessment. Patient resides in the home with his and identifies them as their primary support system.Patient reports loss of interest to do things they enjoy, fatigue, lack of concentration, and worthlessness. Patient denies history of past suicide attempts. Patient has a hx of Substance Abuse:  marijuana with the last use 3 days ago. Pt reports that he currently quitting marijuana .Patient denies NSSIB,HI, AVH. Pt reports that since quitting marijuana he has endorsed passive SI thought but denies a plan and intent to harm himself.   Patient denies history of abuse or trauma. Patient denies current legal problems. Patient is not receiving outpatient therapy and psychiatry services,but reports he is willing to seek outpatient treatment. Patient reports he  takes his medications as prescribed (see MAR) and denies recent medication changes. Patient denies previous inpatient admission. Patient denies access to weapons.   Patient is can to contract for safety outside of the hospital.  Patient gives verbal consent for [facility] to speak with his father [name]). Pt's father was present with him at the ED. Per father patient has hx of struggling with depression  and passive SI. Father reports that he feel safe with pt returning home and is willing to monitor pt's safety. Pt's father reports that he is willing to assist with pt getting started with outpatient services.   Treatment options were discussed and patient and pt's father are in agreement with recommendation for discharged .   During evaluation pt is in no acute distress. He is alert, oriented x 4, calm, cooperative and attentive. his mood is appropriate with congruent affect. He has normal speech, and behavior.  Objectively there is no evidence of psychosis/mania or delusional thinking.  Patient is able to converse coherently, goal directed thoughts, no distractibility, or pre-occupation.   He also denies self-harm/homicidal ideation, psychosis, and paranoia.  Patient answered question appropriately.      Chief Complaint:  Chief Complaint  Patient presents with   Chest Pain   Depression   Visit Diagnosis: Depression and Anxiety     CCA Screening, Triage and Referral (STR)  Patient Reported Information How did you hear about us ? -- ([facility] ED)  What Is the Reason for Your Visit/Call Today? Per EDP's note : Patient is a 24 year old male with a past medical history of depression, anxiety and GERD presenting to the emergency department with chest pain and SI.  Patient states for the last 3 days he has had midsternal chest and epigastric pain.  He states that it feels like an intense pressure type of pain and occasional sharp pain.  He endorses associated shortness of breath.  He denies any fever or cough, nausea or vomiting.  He states he has had similar pain in the past and has had peptic ulcers and gastritis in the past.  He states he does take PPI twice daily.  He states that he stopped using marijuana and nicotine 3 days ago and states that he feels like the pain may be due to withdrawing from these.  He denies any history of VTE, no recent hospitalization or surgery, no recent long travel car  plane, hormone use or cancer history.  He also reports that he has had increasing suicidal ideation, denies any plan.  Denies any HI or hallucination  How Long Has This Been Causing You Problems? 1 wk - 1 month  What Do You Feel Would Help You the Most Today? Treatment for Depression or other mood problem; Stress Management   Have You Recently Had Any Thoughts About Hurting Yourself? Yes  Are You Planning to Commit Suicide/Harm Yourself At This time? No   Flowsheet Row ED from 07/06/2024 in [facility] Emergency Department at [facility] ED from [facility]/04/2024 in [facility] Emergency Department at [facility] ED from 01/31/2024 in [facility] Emergency Department at [facility]  C-SSRS RISK CATEGORY Low Risk No Risk No Risk    Have you Recently Had Thoughts About Hurting Someone [name]? No  Are You Planning to Harm Someone at This Time? No  Explanation: Denies HI   Have You Used Any Alcohol or Drugs in the Past 24 Hours? No  How Long Ago Did You Use Drugs or Alcohol? No data recorded What Did You Use and How Much? No data recorded  Do You Currently Have a Therapist/Psychiatrist? No  Name of Therapist/Psychiatrist:    Have You Been Recently Discharged From Any Office Practice or Programs? No  Explanation of Discharge From Practice/Program: No data recorded    CCA Screening Triage Referral Assessment Type of Contact: Tele-Assessment  Telemedicine Service Delivery: Telemedicine service delivery: This service was provided via telemedicine using a 2-way, interactive audio and video technology  Is this Initial or Reassessment? Is this Initial or Reassessment?: Initial Assessment  Date Telepsych consult ordered in [facility]:  Date Telepsych consult ordered in [facility]: 07/06/24  Time Telepsych consult ordered in [facility]:  Time Telepsych consult ordered in [facility]: 1829  Location of Assessment: [facility] ED  Provider Location: [facility] Assessment Services   Collateral Involvement:  Dad: [name]   Does Patient Have a Automotive Engineer Guardian? No  Legal Guardian Contact Information: n/a  Copy of Legal Guardianship Form: -- (n/a)  Legal Guardian Notified of Arrival: -- (n/a)  Legal Guardian Notified of Pending Discharge: -- (n/a)  If Minor and Not Living with Parent(s), Who has Custody? n/a  Is CPS involved or ever been involved? Never  Is APS involved or ever been involved? Never   Patient Determined To Be At Risk for Harm To Self or Others Based on Review of Patient Reported Information or Presenting Complaint? Yes, for Self-Harm  Method: No Plan  Availability of Means: No access or NA  Intent: Vague intent or NA  Notification Required: No need or identified person  Additional Information for Danger to Others Potential: -- (n/a)  Additional Comments for Danger to Others Potential: n/a  Are There Guns or Other Weapons in Your Home? Yes  Types of Guns/Weapons: One gun  Are These Weapons Safely Secured?                            Yes  Who Could Verify You Are Able To Have These Secured: Dad is the owner of gun and confirmed that gun is in a safe  Do You Have  any Outstanding Charges, Pending Court Dates, Parole/Probation? Denies pending legal charges  Contacted To Inform of Risk of Harm To Self or Others: -- (n/a)    Does Patient Present under Involuntary Commitment? No    [place] of Residence: [place]   Patient Currently Receiving the Following Services: Medication Management   Determination of Need: Routine (7 days)   Options For Referral: Medication Management; Outpatient Therapy     CCA Biopsychosocial Patient Reported Schizophrenia/Schizoaffective Diagnosis in Past: No   Strengths: Pt is able to express feeling and emotions. Pt is willing to seek outpatient treatment   Mental Health Symptoms Depression:  Change in energy/activity; Hopelessness; Worthlessness   Duration of Depressive symptoms: Duration of  Depressive Symptoms: Greater than two weeks   Mania:  None   Anxiety:   Worrying   Psychosis:  None   Duration of Psychotic symptoms:    Trauma:  None   Obsessions:  None   Compulsions:  None   Inattention:  None   Hyperactivity/Impulsivity:  None   Oppositional/Defiant Behaviors:  None   Emotional Irregularity:  Mood lability   Other Mood/Personality Symptoms:  none    Mental Status Exam Appearance and self-care  Stature:  Average   Weight:  Average weight   Clothing:  Casual   Grooming:  Normal   Cosmetic use:  None   Posture/gait:  Normal   Motor activity:  Not Remarkable   Sensorium  Attention:  Normal   Concentration:  Normal   Orientation:  X5   Recall/memory:  Normal   Affect and Mood  Affect:  Appropriate   Mood:  Euthymic   Relating  Eye contact:  Normal   Facial expression:  Responsive   Attitude toward examiner:  Cooperative   Thought and Language  Speech flow: Clear and Coherent   Thought content:  Appropriate to Mood and Circumstances   Preoccupation:  Suicide   Hallucinations:  None   Organization:  Patent Examiner of Knowledge:  Good   Intelligence:  Average   Abstraction:  Normal   Judgement:  Good   Reality Testing:  Realistic   Insight:  Good   Decision Making:  Normal   Social Functioning  Social Maturity:  Responsible   Social Judgement:  Normal   Stress  Stressors:  Transitions   Coping Ability:  Set Designer Deficits:  Self-care   Supports:  Family     Religion: Religion/Spirituality Are You A Religious Person?: No How Might This Affect Treatment?: n/a  Leisure/Recreation: Leisure / Recreation Do You Have Hobbies?: No  Exercise/Diet: Exercise/Diet Do You Exercise?: No Have You Gained or Lost A Significant Amount of Weight in the Past Six Months?: No Do You Follow a Special Diet?: No Do You Have Any Trouble Sleeping?: No   CCA  Employment/Education Employment/Work Situation: Employment / Work Situation Employment Situation: Unemployed Patient's Job has Been Impacted by Current Illness: No Has Patient ever Been in Equities Trader?: No  Education: Education Is Patient Currently Attending School?: No Last Grade Completed: 12 Did You Product Manager?: Yes 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: No Was the patient ever a victim of a crime or a disaster?: No Witnessed domestic violence?: No Has patient been affected by domestic violence as an adult?: No       CCA Substance Use Alcohol/Drug Use: Alcohol / Drug Use Pain Medications: See MAR Prescriptions: See MAR Over the Counter: See MAR History of alcohol / drug use?: No history of alcohol / drug abuse Longest period of sobriety (when/how long): Pt reports that he quit using marijuana for the past 3 days no other etoh or drug use Negative Consequences of Use:  (n/a) Withdrawal Symptoms:  (n/a)                         ASAM's:  Six Dimensions of Multidimensional Assessment  Dimension 1:  Acute Intoxication and/or Withdrawal Potential:      Dimension 2:  Biomedical Conditions and Complications:      Dimension 3:  Emotional, Behavioral, or Cognitive Conditions and Complications:     Dimension 4:  Readiness to Change:     Dimension  5:  Relapse, Continued use, or Continued Problem Potential:     Dimension 6:  Recovery/Living Environment:     ASAM Severity Score:    ASAM Recommended Level of Treatment: ASAM Recommended Level of Treatment:  (n/a)   Substance use Disorder (SUD) Substance Use Disorder (SUD)  Checklist Symptoms of Substance Use:  (n/a)  Recommendations for Services/Supports/Treatments: Recommendations for Services/Supports/Treatments Recommendations For Services/Supports/Treatments: Individual Therapy, Medication Management  Disposition Recommendation per psychiatric provider: There are no psychiatric contraindications to discharge at this time   DSM5 Diagnoses: Patient Active Problem List   Diagnosis Date Noted   Chest pain, atypical 02/05/2024   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

## 2024-07-06 NOTE — ED Triage Notes (Signed)
 Pt here with chest pain, tremors for the last three days. Pt thinking it is due to drug withdrawal. Last used marijuana 3 days ago. Pt also with increasing thoughts of self harm. States every morning he wakes up wishing he wasn't here.
